# Patient Record
Sex: Female | Born: 1980
Health system: Southern US, Community
[De-identification: ages and names within clinical notes are randomized; demographics above are authoritative.]

## PROBLEM LIST (undated history)

## (undated) DIAGNOSIS — F329 Major depressive disorder, single episode, unspecified: Secondary | ICD-10-CM

## (undated) DIAGNOSIS — F32A Depression, unspecified: Secondary | ICD-10-CM

## (undated) DIAGNOSIS — Z87442 Personal history of urinary calculi: Secondary | ICD-10-CM

## (undated) DIAGNOSIS — K8020 Calculus of gallbladder without cholecystitis without obstruction: Secondary | ICD-10-CM

## (undated) DIAGNOSIS — R109 Unspecified abdominal pain: Principal | ICD-10-CM

## (undated) DIAGNOSIS — F419 Anxiety disorder, unspecified: Secondary | ICD-10-CM

## (undated) HISTORY — PX: TUBAL LIGATION: SHX77

## (undated) HISTORY — DX: Anxiety disorder, unspecified: F41.9

## (undated) HISTORY — PX: CHOLECYSTECTOMY: SHX55

## (undated) HISTORY — DX: Depression, unspecified: F32.A

## (undated) HISTORY — PX: COSMETIC SURGERY: SHX468

## (undated) HISTORY — PX: BREAST SURGERY: SHX581

## (undated) HISTORY — DX: Unspecified abdominal pain: R10.9

## (undated) HISTORY — DX: Calculus of gallbladder without cholecystitis without obstruction: K80.20

---
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
## 2011-08-31 ENCOUNTER — Emergency Department: Payer: Self-pay | Admitting: Emergency Medicine

## 2011-09-02 ENCOUNTER — Emergency Department: Payer: Self-pay | Admitting: Emergency Medicine

## 2012-10-08 DIAGNOSIS — K8020 Calculus of gallbladder without cholecystitis without obstruction: Secondary | ICD-10-CM

## 2012-10-08 DIAGNOSIS — R109 Unspecified abdominal pain: Secondary | ICD-10-CM

## 2012-10-08 HISTORY — DX: Calculus of gallbladder without cholecystitis without obstruction: K80.20

## 2012-10-08 HISTORY — DX: Unspecified abdominal pain: R10.9

## 2012-12-08 ENCOUNTER — Emergency Department: Payer: Self-pay | Admitting: Emergency Medicine

## 2012-12-08 LAB — URINALYSIS, COMPLETE
Bilirubin,UR: NEGATIVE
Blood: NEGATIVE
Ketone: NEGATIVE
Leukocyte Esterase: NEGATIVE
Ph: 7 (ref 4.5–8.0)
RBC,UR: <1 /HPF (ref 0–5)
Specific Gravity: 1.017 (ref 1.003–1.030)

## 2012-12-08 LAB — BASIC METABOLIC PANEL
Anion Gap: 7 (ref 7–16)
BUN: 13 mg/dL (ref 7–18)
Calcium, Total: 8.5 mg/dL (ref 8.5–10.1)
Co2: 26 mmol/L (ref 21–32)
EGFR (African American): >60
Glucose: 93 mg/dL (ref 65–99)
Sodium: 139 mmol/L (ref 136–145)

## 2012-12-08 LAB — CBC
HCT: 37.2 % (ref 35.0–47.0)
HGB: 12.4 g/dL (ref 12.0–16.0)
MCH: 28.3 pg (ref 26.0–34.0)
MCHC: 33.2 g/dL (ref 32.0–36.0)
MCV: 85 fL (ref 80–100)
RDW: 13.7 % (ref 11.5–14.5)
WBC: 10.4 10*3/uL (ref 3.6–11.0)

## 2012-12-08 LAB — LIPASE, BLOOD: Lipase: 133 U/L (ref 73–393)

## 2013-01-14 ENCOUNTER — Ambulatory Visit: Payer: Self-pay | Admitting: Family Medicine

## 2013-01-19 ENCOUNTER — Encounter: Payer: Self-pay | Admitting: General Surgery

## 2013-01-19 ENCOUNTER — Ambulatory Visit (INDEPENDENT_AMBULATORY_CARE_PROVIDER_SITE_OTHER): Payer: 59 | Admitting: General Surgery

## 2013-01-19 VITALS — BP 100/65 | HR 80 | Resp 16 | Ht 62.0 in | Wt 181.0 lb

## 2013-01-19 DIAGNOSIS — K8010 Calculus of gallbladder with chronic cholecystitis without obstruction: Secondary | ICD-10-CM

## 2013-01-19 DIAGNOSIS — K8020 Calculus of gallbladder without cholecystitis without obstruction: Secondary | ICD-10-CM | POA: Insufficient documentation

## 2013-01-19 DIAGNOSIS — R109 Unspecified abdominal pain: Secondary | ICD-10-CM

## 2013-01-19 NOTE — Progress Notes (Signed)
Patient ID: Shelly Williams, female   DOB: 01-15-1981, 32 y.o.   MRN: [id number]  Chief Complaint  Patient presents with  . Abdominal Pain    gallbladder     HPI Shelly Williams is a 32 y.o. female who presents for an evaluation of her gallbladder.  Abdominal Pain The current episode started more than 1 month ago. The onset quality is sudden. The problem has been gradually worsening. The pain is located in the RUQ. The pain is severe. The quality of the pain is sharp. The abdominal pain does not radiate. Associated symptoms include nausea and vomiting. Nothing aggravates the pain. The pain is relieved by nothing. She has tried nothing for the symptoms. Prior diagnostic workup includes ultrasound.    Past Medical History  Diagnosis Date  . Abdominal pain 2014  . Gallstones 2014    History reviewed. No pertinent past surgical history.  Family History  Problem Relation Age of Onset  . Cancer Maternal Aunt     lung  . Cancer Other     lung - maternal great aunt  . Cancer Maternal Aunt     breast    Social History History  Substance Use Topics  . Smoking status: Never Smoker   . Smokeless tobacco: Never Used  . Alcohol Use: No    Allergies  Allergen Reactions  . Motrin (Ibuprofen) Rash    No current outpatient prescriptions on file.   No current facility-administered medications for this visit.    Review of Systems Review of Systems  Constitutional: Positive for chills.  Respiratory: Negative.   Cardiovascular: Negative.   Gastrointestinal: Positive for nausea, vomiting and abdominal pain.    Blood pressure 100/65, pulse 80, resp. rate 16, height 5\' 2"  (1.575 m), weight 181 lb (82.101 kg), last menstrual period 12/16/2012.  Physical Exam Physical Exam  Constitutional: She appears well-developed and well-nourished.  Eyes: Conjunctivae are normal. No scleral icterus.  Neck: Trachea normal. No mass and no thyromegaly present.  Cardiovascular: Normal rate, regular  rhythm, normal heart sounds and normal pulses.   No murmur heard. Pulmonary/Chest: Effort normal and breath sounds normal.  Abdominal: Soft. Normal appearance and bowel sounds are normal. There is no hepatosplenomegaly. There is no tenderness. No hernia.    Data Reviewed Ultrasound reviewed showing large impacted stone in gallbladder neck. No evidence of acute cholecystitis. CBC and liver functions normal.   Assessment    Cholelithiasis, chronic cholecystitis     Plan    Discussed laparoscopic cholecystectomy, risks and benefits.  Pt is agreeable.       SANKAR,SEEPLAPUTHUR G 01/19/2013, 7:13 PM

## 2013-01-19 NOTE — Patient Instructions (Addendum)
Patient advised that gallbladder surgery is necessary at this time.  Patient advised she will be out of work for 5-7 days.   Laparoscopic Cholecystectomy Laparoscopic cholecystectomy is surgery to remove the gallbladder. The gallbladder is located slightly to the right of center in the abdomen, behind the liver. It is a concentrating and storage sac for the bile produced in the liver. Bile aids in the digestion and absorption of fats. Gallbladder disease (cholecystitis) is an inflammation of your gallbladder. This condition is usually caused by a buildup of gallstones (cholelithiasis) in your gallbladder. Gallstones can block the flow of bile, resulting in inflammation and pain. In severe cases, emergency surgery may be required. When emergency surgery is not required, you will have time to prepare for the procedure. Laparoscopic surgery is an alternative to open surgery. Laparoscopic surgery usually has a shorter recovery time. Your common bile duct may also need to be examined and explored. Your caregiver will discuss this with you if he or she feels this should be done. If stones are found in the common bile duct, they may be removed. LET YOUR CAREGIVER KNOW ABOUT:  Allergies to food or medicine.  Medicines taken, including vitamins, herbs, eyedrops, over-the-counter medicines, and creams.  Use of steroids (by mouth or creams).  Previous problems with anesthetics or numbing medicines.  History of bleeding problems or blood clots.  Previous surgery.  Other health problems, including diabetes and kidney problems.  Possibility of pregnancy, if this applies. RISKS AND COMPLICATIONS All surgery is associated with risks. Some problems that may occur following this procedure include:  Infection.  Damage to the common bile duct, nerves, arteries, veins, or other internal organs such as the stomach or intestines.  Bleeding.  A stone may remain in the common bile duct. BEFORE THE  PROCEDURE  Do not take aspirin for 3 days prior to surgery or blood thinners for 1 week prior to surgery.  Do not eat or drink anything after midnight the night before surgery.  Let your caregiver know if you develop a cold or other infectious problem prior to surgery.  You should be present 60 minutes before the procedure or as directed. PROCEDURE  You will be given medicine that makes you sleep (general anesthetic). When you are asleep, your surgeon will make several small cuts (incisions) in your abdomen. One of these incisions is used to insert a small, lighted scope (laparoscope) into the abdomen. The laparoscope helps the surgeon see into your abdomen. Carbon dioxide gas will be pumped into your abdomen. The gas allows more room for the surgeon to perform your surgery. Other operating instruments are inserted through the other incisions. Laparoscopic procedures may not be appropriate when:  There is major scarring from previous surgery.  The gallbladder is extremely inflamed.  There are bleeding disorders or unexpected cirrhosis of the liver.  A pregnancy is near term.  Other conditions make the laparoscopic procedure impossible. If your surgeon feels it is not safe to continue with a laparoscopic procedure, he or she will perform an open abdominal procedure. In this case, the surgeon will make an incision to open the abdomen. This gives the surgeon a larger view and field to work within. This may allow the surgeon to perform procedures that sometimes cannot be performed with a laparoscope alone. Open surgery has a longer recovery time. AFTER THE PROCEDURE  You will be taken to the recovery area where a nurse will watch and check your progress.  You may be allowed to  go home the same day.  Do not resume physical activities until directed by your caregiver.  You may resume a normal diet and activities as directed. Document Released: 09/24/2005 Document Revised: 12/17/2011  Document Reviewed: 03/09/2011 [facility] Patient Information 2013 [place], [place].  Patient's surgery has been scheduled for 01-22-13 at [facility]

## 2013-01-22 ENCOUNTER — Ambulatory Visit: Payer: Self-pay | Admitting: General Surgery

## 2013-01-22 DIAGNOSIS — K8010 Calculus of gallbladder with chronic cholecystitis without obstruction: Secondary | ICD-10-CM

## 2013-01-26 ENCOUNTER — Encounter: Payer: Self-pay | Admitting: General Surgery

## 2013-01-26 ENCOUNTER — Encounter: Payer: Self-pay | Admitting: *Deleted

## 2013-01-27 ENCOUNTER — Encounter: Payer: Self-pay | Admitting: General Surgery

## 2013-02-02 ENCOUNTER — Encounter: Payer: 59 | Admitting: General Surgery

## 2013-02-18 ENCOUNTER — Encounter: Payer: 59 | Admitting: General Surgery

## 2013-03-05 ENCOUNTER — Encounter: Payer: Self-pay | Admitting: *Deleted

## 2013-12-24 ENCOUNTER — Encounter: Payer: Self-pay | Admitting: Obstetrics & Gynecology

## 2014-01-04 ENCOUNTER — Encounter
Admit: 2014-01-04 | Disposition: A | Discharge status: Home / Self Care | Payer: Self-pay | Admitting: Maternal & Fetal Medicine

## 2014-02-15 ENCOUNTER — Emergency Department: Payer: Self-pay | Admitting: Emergency Medicine

## 2014-05-03 ENCOUNTER — Inpatient Hospital Stay: Payer: Self-pay

## 2014-05-03 LAB — CBC WITH DIFFERENTIAL/PLATELET
Basophil #: 0.1 10*3/uL (ref 0.0–0.1)
Basophil %: 0.5 %
EOS PCT: 0.9 %
Eosinophil #: 0.1 10*3/uL (ref 0.0–0.7)
HCT: 38.2 % (ref 35.0–47.0)
HGB: 12.2 g/dL (ref 12.0–16.0)
Lymphocyte #: 2.1 10*3/uL (ref 1.0–3.6)
Lymphocyte %: 18.1 %
MCH: 28 pg (ref 26.0–34.0)
MCHC: 31.8 g/dL — ABNORMAL LOW (ref 32.0–36.0)
MCV: 88 fL (ref 80–100)
Monocyte #: 1 x10 3/mm — ABNORMAL HIGH (ref 0.2–0.9)
Monocyte %: 8.3 %
Neutrophil #: 8.4 10*3/uL — ABNORMAL HIGH (ref 1.4–6.5)
Neutrophil %: 72.2 %
Platelet: 115 10*3/uL — ABNORMAL LOW (ref 150–440)
RBC: 4.34 10*6/uL (ref 3.80–5.20)
RDW: 16 % — ABNORMAL HIGH (ref 11.5–14.5)
WBC: 11.6 10*3/uL — ABNORMAL HIGH (ref 3.6–11.0)

## 2014-05-05 LAB — HEMATOCRIT: HCT: 33.2 % — ABNORMAL LOW (ref 35.0–47.0)

## 2014-05-06 LAB — PATHOLOGY REPORT

## 2014-08-10 ENCOUNTER — Encounter: Payer: Self-pay | Admitting: General Surgery

## 2015-01-28 NOTE — Op Note (Signed)
PATIENT NAME:  Shelly Williams, Shelly Williams MR#:  [id number] DATE OF BIRTH:  02/18/1981  DATE OF PROCEDURE:  01/22/2013  PREOPERATIVE DIAGNOSIS: Chronic cholecystitis and cholelithiasis.   POSTOPERATIVE DIAGNOSIS: Chronic cholecystitis and cholelithiasis.   PROCEDURE:  Laparoscopy and cholecystectomy with intraoperative cholangiogram.   SURGEON: Mckinley Jewel, M.D.   ANESTHESIA: General.   COMPLICATIONS: None.   ESTIMATED BLOOD LOSS: Less than 25 mL.   DRAINS: None.   DESCRIPTION OF PROCEDURE: The patient was put to sleep in the supine position on the operating table. The abdomen was prepped and draped out as a sterile field. A small incision was made in the upper lip of the umbilicus and a Veress needle with the InnerDyne sleeve was positioned in the peritoneal cavity and verified with the hanging drop method. Pneumoperitoneum was obtained and a 10 mm port was placed. The camera was introduced with good visualization of the peritoneal cavity and no injury to the underlying structures from initial entry noted.  Epigastric and 2 lateral 5 mm ports were placed. The gallbladder was noted to be mildly thickened in its wall, but showed no evidence of acute changes. Minimal adhesions near the midportion down to the Hartmann's pouch were easily taken down to expose the cystic artery which was lying medial to the cystic duct.  Both of these structures were circumferentially freed. The common bile duct appeared to be of normal size. She did have a lymph node that appeared to be mildly enlarged on the course of the cystic artery near the gallbladder neck.  The Kumar clamp and catheter were positioned. Cholangiogram was then performed which showed normal appearing bile duct, both proximal and distal, no filling defects and no obstruction to flow. The cystic duct was somewhat long and tortuous and seemed to go further down before connecting with the bile duct. After this the catheter was used to decompress the gallbladder  and then removed.  The cystic artery and duct were then clipped and cut. The gallbladder was dissected free from its bed using cautery for control of bleeding. A small amount of fluid was used to irrigate this area and suctioned out. The gallbladder was then placed into a retrieval bag and brought out through the umbilical port site and noted that the patient had a very large stone which required slight extension of both the fascial and moderate extension of the skin incisions to remove. It was noted there was in addition some small stones and sludge. There was a large stone somewhat egg-shaped over 3 cm in size. Pneumoperitoneum was released and remaining ports were removed. The fascial opening at the umbilicus was closed with 2 figure-of-eight stitches of 0 Vicryl. All skin incisions were closed with subcuticular 4-0 Vicryl reinforced with Steri-Strips. A dry sterile dressing was placed. The patient tolerated the procedure well. There were no immediate problems encountered. She was extubated and returned to the recovery room in stable condition.  ____________________________ S.Robinette Haines, MD sgs:sb D: 01/22/2013 11:28:32 ET T: 01/22/2013 11:40:32 ET JOB#: [id number]  cc: S.G. Jamal Collin, MD, <Dictator> [facility] Robinette Haines MD ELECTRONICALLY SIGNED 01/22/2013 20:55

## 2015-01-29 NOTE — Consult Note (Signed)
Referral Information:  Reason for Referral Prior pregnancy with cerclage, here for recommendations.   Referring Physician [facility]   Prenatal Hx Shelly Williams is a 34 year-old [place] at 47 1/7 weeks ([place] 05/05/14) who presents for recommendations in her current pregnancy.  Her Ob history is as follows.  In 2001, during her first pregnancy, Shelly Williams states that she had regular contrations at 3-4 months of gestation and had a cerclage placed, afterwhich she delivered at term.  Her second pregnancy was an uncomplicated first trimester miscarriage. For her third pregnancy she had a prophylactic cerclage placed since she had a cerclage in her first pregnancy.  Finally, her fourth pregnancy she carried to term and did not have a cerclage.  She is without complaints. She denies vaginal bleeding, cramping, pelvic pressure or abnormal vaginal discharge.   Past Obstetrical Hx G5 P3013 2001: Had cramping/contractions at "3-4 months" and underwent cerclage placement. Carried to term. Had Induction of labor at 41 weeks and uncomplicated spontaneous vaginal delivery. Female infant weighing 7 pound 2 ounces 0932: Uncomplicated first trimester misscarriage, required D&C 2006: Prophylactic cerclage. Carried to term. Spontaneous labor at 39 weeks. Female infant weighing 7.5 pounds. No complications 3557: No cerclage. No 17P. Carried to term. Spontaneous labor at 39 weeks. Female infant weighing 6.5 pounds. No complications   Allergies:   Ibuprofen: Rash  Vital Signs/Notes:  Nursing Vital Signs:  **Vital Signs.:   19-Mar-15 08:58  Pulse Pulse 84  Systolic BP Systolic BP 322  Diastolic BP (mmHg) Diastolic BP (mmHg) 52   Perinatal Consult:  PGyn Hx Remote history of abnormal pap. Denies any procedures performed on her cervix    Past Medical History cont'd Denies history of HTN, diabetes, thyroid disorders   PSurg Hx As in HPI.  Cerclage times 2. D&C.  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 No vaginal bleeding, contractions, pelvic pressure, change in vaginal discharge, urine complaints.   Abdominal Pain No    Nausea/Vomiting No    SOB/DOE No    Exam:  Cervix Long/closed/high/firm and posterior    Additional Lab/Radiology Notes Prenatal labs (10/20/13): Blood type A positive, antibody screen negative, HIV non-reactive, Hep B negative, Varicella immune, Rubella immune, Hct 36.4, Plt 143, MCV 85, Hemoglobin electrophoresis normal, Early glucola 109, pap normal  Repeat CBC (10/27/13): Hct 34.0, MCV 84, Plt count 144  First trimester screen [name]):  Down syndrome risk less than 1 in 10,000.  Trisomy 18 risk less than 1 in 10,000   Impression/Recommendations:  Impression 34 year-old G5 P3013 at 22 1/7 weeks with two prior pregnancies with cerclages, the first for what sounds like preterm cramping and the second as a prophylactic measure.  Her most recent pregnancy was carried to term without cerclage.   Recommendations From her history, it does not appear that [place] has a history of painless cervical dilation or cervical shortening to suggest that she has an incompetent cervix. Furthermore, she carried her last pregnancy to 39 weeks without a cerclage. Her cervical length on [place] today is reassuring as is her digital cervical examination.  Recommend to follow cervical lengths by [place] weekly. We have scheduled the next two to be done at [facility] but can be done at [place] if  preferred. Shoudl there be signs of cervical shortening, then we can discuss further options. We also discussed signs and symptoms of preterm labor and cervical incompetence.  Her platelet count is mildly low (140s in January).  She denies a  history of thrombocytopenia. Recommend to check monthly and send her back for further recommendations should her platelet count fall below 100.    Total Time Spent with Patient 60 minutes   >50% of visit spent in couseling/coordination of care yes   Office Use Only 99244  Level 4 (62min) NEW office consult low complexity   Coding Description: MATERNAL CONDITIONS/HISTORY INDICATION(S).   OTHER: history of cerclage in prior pregnancy..  Electronic Signatures: Quantarius Genrich, [place] (MD)  (Signed 19-Mar-15 12:17)  Authored: Referral, Home Medications, Allergies, Vital Signs/Notes, Consult, Exam, Lab/Radiology Notes, Impression, Billing, Coding Description   Last Updated: 19-Mar-15 12:17 by Orra Nolde, [place] (MD)

## 2015-01-29 NOTE — Op Note (Signed)
PATIENT NAME:  Shelly Williams, Shelly Williams MR#:  [id number] DATE OF BIRTH:  1981-09-10  DATE OF PROCEDURE:  05/04/2014  PREOPERATIVE DIAGNOSIS:   1.  A 34 year old G5, P4-0-1-4, postpartum day #1 following uncomplicated vaginal delivery.  2.  Desires permanent surgical sterilization.   POSTOPERATIVE DIAGNOSIS:  1.  A 34 year old G5, P4-0-1-4, postpartum day #1 following uncomplicated vaginal delivery.  2.  Desires permanent surgical sterilization.    OPERATION PERFORMED: Bilateral postpartum tubal ligation via Pomeroy method.   ANESTHESIA: General.   PRIMARY SURGEON:  Stoney Bang. Georgianne Fick, MD.   PREOPERATIVE ANTIBIOTICS: None.   DRAINS AND TUBES: None.   IMPLANTS: None.   ESTIMATED BLOOD LOSS: Minimal.   OPERATIVE FLUIDS: 500 mL.   COMPLICATIONS: None.   FINDINGS: Normal tubes and ovaries with normal-appearing uterine fundus at the level of the umbilicus. Following excision of the tubes bilateral cross section of tubal ostia were visualized.  The tubes were hemostatic. Both tubes were walked out to the fimbriated ends prior to transection.   SPECIMENS REMOVED: Portions of right and left tubes.   PATIENT CONDITION FOLLOWING PROCEDURE: Stable.   PROCEDURE IN DETAIL: Risks, benefits, and alternatives as well as the permanent nature of the procedure were discussed with the patient prior proceeding to operating room. The patient was taken to the operating room where she was placed under general endotracheal anesthesia. She was positioned in the supine position, prepped and draped in the usual sterile fashion. A timeout was performed. Attention was turned to the patient's abdomen. The umbilicus was infiltrated with 0.5% plain Sensorcaine.   A vertical incision was then made utilizing the linea nigra and extended approximately 3 cm inferior to the umbilicus. The subcutaneous tissue was dissected off the fascia using a hemostat. The fascia was grasped with a hemostat, tented up, and regrasped with a  2nd hemostat. The 1st  hemostat released the fascia and was regrasped. Mayo scissors were then used to make the fascial incision. The fascial incision was extended by spreading the Mayo scissors. The peritoneum was then identified, grasped with a hemostat, tented up in a similar fashion as the fascia. The scissors tips were clearly seen through the peritoneum prior to incising the peritoneum.    Once intraabdominal entry had been established an Army-Navy retractor was used to visualize the intraabdominal contents. The patient was airplaned to her right. A moist mini-lap was then used to pack away the omentum. The left tube was identified at the cornual section and walked out to its fimbriated end before walking it back to the mid-isthmic portion. It was then doubly suture ligated using a 0-chromic wheel. The intervening knuckle of tube was then excised using Metzenbaum scissors. The tubal segment was returned to the abdomen. The mini lap was removed.   The patient was airplaned to her left. The mini-lap was replaced to displace the omentum. The right tube was identified in its cornual region, walked out to its fimbriated end before walking back to the mid-isthmic portion. It was then also doubly suture ligated using a 0-chromic wheel. The intervening knuckle of tube was excised using Metzenbaum scissors. The tubal segment was inspected and noted to be hemostatic. The tubal ostia were clearly visualized as they had been on the left.  The tubal segment was returned to the abdomen.   The mini-lap was removed. The fascia was grasped with 2 hemostats. The fascial defect was then closed using a 0-Vicryl on a UR-6. Following closure of the fascia the skin was closed using 4-0  Monocryl in subcuticular fashion. The incision was dressed with Dermabond. Sponge, needle, and instrument counts were correct x2. The patient tolerated the procedure well and was taken to the recovery room in stable condition.      ____________________________ Stoney Bang. Georgianne Fick, MD ams:lt D: 05/04/2014 09:35:52 ET T: 05/04/2014 10:22:25 ET JOB#: [id number]  cc: Stoney Bang. Georgianne Fick, MD, <Dictator> Dorthula Nettles MD ELECTRONICALLY SIGNED 05/05/2014 6:07

## 2015-02-15 NOTE — H&P (Signed)
L&D Evaluation:  History Expanded:  HPI 34 yo G5 P3013 with EDD of 05/05/14 per 11 wk [place]. Presents with c/o regular contractions since 1830. Denies LOF, decreased FM or VB. PNC at [facility], early entry to care, thrombocytopenia. H/o cerclage during prior pregnancies -cervical length remained long during this pregnancy. Desires PP BTL.   Blood Type (Maternal) A positive   Group B Strep Results Maternal (Result >5wks must be treated as unknown) negative   Maternal HIV Negative   Maternal Syphilis Ab Nonreactive   Maternal Varicella Immune   Rubella Results (Maternal) immune   Patient's Medical History No Chronic Illness   Patient's Surgical History Colecystectomy   Medications Pre Natal Vitamins   Allergies NKDA   Social History none   Exam:  Vital Signs stable   General no apparent distress   Mental Status clear   Chest clear   Heart no murmur/gallop/rubs   Abdomen gravid, tender with contractions   Estimated Fetal Weight Average for gestational age, 26 lb weight gain, EFW at 33 wks 55%   Pelvic 4-5 cm per RN   Mebranes Intact   FHT normal rate with no decels, decreased variability after stadol   Ucx regular   Impression:  Impression active labor   Plan:  Comments Awaiting labs for epidural Anticipate vaginal delivery   Electronic Signatures: Ander Purpura (CNM)  (Signed 27-Jul-15 20:50)  Authored: L&D Evaluation   Last Updated: 27-Jul-15 20:50 by Ander Purpura (CNM)

## 2017-06-01 ENCOUNTER — Emergency Department (HOSPITAL_COMMUNITY): Payer: BLUE CROSS/BLUE SHIELD

## 2017-06-01 ENCOUNTER — Encounter (HOSPITAL_COMMUNITY): Payer: Self-pay | Admitting: *Deleted

## 2017-06-01 ENCOUNTER — Emergency Department (HOSPITAL_COMMUNITY)
Admission: EM | Admit: 2017-06-01 | Discharge: 2017-06-01 | Disposition: A | Discharge status: Home / Self Care | Payer: BLUE CROSS/BLUE SHIELD | Attending: Emergency Medicine | Admitting: Emergency Medicine

## 2017-06-01 DIAGNOSIS — R110 Nausea: Secondary | ICD-10-CM | POA: Insufficient documentation

## 2017-06-01 DIAGNOSIS — G4489 Other headache syndrome: Secondary | ICD-10-CM

## 2017-06-01 DIAGNOSIS — R200 Anesthesia of skin: Secondary | ICD-10-CM | POA: Diagnosis not present

## 2017-06-01 DIAGNOSIS — R51 Headache: Secondary | ICD-10-CM | POA: Diagnosis present

## 2017-06-01 MED ORDER — METOCLOPRAMIDE HCL 10 MG PO TABS: 10.0000 mg | ORAL_TABLET | Freq: Three times a day (TID) | ORAL | 0 refills | Status: DC | PRN

## 2017-06-01 MED ORDER — METOCLOPRAMIDE HCL 5 MG/ML IJ SOLN
10.0000 mg | Freq: Once | INTRAMUSCULAR | Status: AC
  Administered 2017-06-01: 10 mg via INTRAVENOUS
  Filled 2017-06-01: qty 2

## 2017-06-01 MED ORDER — DIPHENHYDRAMINE HCL 50 MG/ML IJ SOLN
25.0000 mg | Freq: Once | INTRAMUSCULAR | Status: AC
  Administered 2017-06-01: 25 mg via INTRAVENOUS
  Filled 2017-06-01: qty 1

## 2017-06-01 NOTE — ED Provider Notes (Signed)
Reviewed CT findings with dr Jola Baptist, radiology No acute findings    Ripley Fraise, MD 06/01/17 [id number]

## 2017-06-01 NOTE — ED Notes (Signed)
EDP at bedside updating patient and family. 

## 2017-06-01 NOTE — ED Provider Notes (Signed)
[facility] DEPT Provider Note   CSN: [id number] Arrival date & time: 06/01/17  7124     History   Chief Complaint Chief Complaint  Patient presents with  . Headache    HPI Shelly Williams is a 36 y.o. female.  The history is provided by the patient and the spouse. A language interpreter was used [id number]).  Headache   This is a chronic problem. The current episode started more than 2 days ago. The problem has been gradually worsening. The quality of the pain is described as throbbing. The pain is moderate. The pain radiates to the left neck. Associated symptoms include nausea. Pertinent negatives include no fever and no vomiting. Treatments tried: excedrin. The treatment provided no relief.  Patient reports intermittent headaches for months She also reports at time she will have numbness to mouth - mostly around her lip, like dental anesthesia Tonight ,her headache returned and worsened and did not improve with excedrin No falls/trauma No fever/vomiting No visual changes Traveled to [place] a month ago, but did have headaches prior to the trip  Past Medical History:  Diagnosis Date  . Abdominal pain 2014  . Gallstones 2014    Patient Active Problem List   Diagnosis Date Noted  . Calculus of gallbladder with other cholecystitis, without mention of obstruction 01/19/2013  . Gallstones     Past Surgical History:  Procedure Laterality Date  . CHOLECYSTECTOMY      OB History    Gravida Para Term Preterm AB Living   4 3     1 3    SAB TAB Ectopic Multiple Live Births   1              Obstetric Comments   1st Menstrual Cycle: 15 1st Pregnancy: 20       Home Medications    Prior to Admission medications   Not on File    Family History Family History  Problem Relation Age of Onset  . Cancer Maternal Aunt        lung  . Cancer Other        lung - maternal great aunt  . Cancer Maternal Aunt        breast    Social History Social History  Substance  Use Topics  . Smoking status: Never Smoker  . Smokeless tobacco: Never Used  . Alcohol use No     Allergies   Motrin [ibuprofen]   Review of Systems Review of Systems  Constitutional: Negative for fever.  Gastrointestinal: Positive for nausea. Negative for vomiting.  Neurological: Positive for numbness and headaches. Negative for weakness.  All other systems reviewed and are negative.    Physical Exam Updated Vital Signs BP 119/74 (BP Location: Left Arm)   Pulse 78   Temp 98 F (36.7 C) (Oral)   Resp 16   LMP 05/22/2017   SpO2 100%   Physical Exam CONSTITUTIONAL: Well developed/well nourished HEAD: Normocephalic/atraumatic EYES: EOMI/PERRL, no nystagmus, no ptosis ENMT: Mucous membranes moist NECK: supple no meningeal signs, no bruits SPINE/BACK:entire spine nontender CV: S1/S2 noted, no murmurs/rubs/gallops noted LUNGS: Lungs are clear to auscultation bilaterally, no apparent distress ABDOMEN: soft, nontender, no rebound or guarding GU:no cva tenderness NEURO:Awake/alert, face symmetric, no arm or leg drift is noted Equal 5/5 strength with shoulder abduction, elbow flex/extension, wrist flex/extension in upper extremities and equal hand grips bilaterally Equal 5/5 strength with hip flexion,knee flex/extension, foot dorsi/plantar flexion Other than reported numbness to left side of lips, Cranial nerves 3/4/5/6/04/15/09/11/12 tested  and intact Gait normal without ataxia No past pointing Sensation to light touch intact in all extremities EXTREMITIES: pulses normal, full ROM SKIN: warm, color normal PSYCH: no abnormalities of mood noted, alert and oriented to situation    ED Treatments / Results  Labs (all labs ordered are listed, but only abnormal results are displayed) Labs Reviewed - No data to display  EKG  EKG Interpretation None       Radiology Ct Head Wo Contrast  Result Date: 06/01/2017 CLINICAL DATA:  Acute headache.  Normal neurologic exam.  EXAM: CT HEAD WITHOUT CONTRAST TECHNIQUE: Contiguous axial images were obtained from the base of the skull through the vertex without intravenous contrast. COMPARISON:  None. FINDINGS: Brain: No intracranial hemorrhage, mass effect, or midline shift. No hydrocephalus. Incidental cavum septum pellucidum, normal variant. The basilar cisterns are patent. No evidence of territorial infarct or acute ischemia. No extra-axial or intracranial fluid collection. Vascular: No hyperdense vessel or unexpected calcification. Skull: Normal. Negative for fracture or focal lesion. Sinuses/Orbits: Paranasal sinuses and mastoid air cells are clear. The visualized orbits are unremarkable. Other: None. IMPRESSION: No acute intracranial abnormality.  No explanation for headache. Electronically Signed   By: Jeb Levering M.D.   On: 06/01/2017 06:58    Procedures Procedures    Medications Ordered in ED Medications  metoCLOPramide (REGLAN) injection 10 mg (10 mg Intravenous Given 06/01/17 0631)  diphenhydrAMINE (BENADRYL) injection 25 mg (25 mg Intravenous Given 06/01/17 0631)     Initial Impression / Assessment and Plan / ED Course  I have reviewed the triage vital signs and the nursing notes.  Pertinent  imaging results that were available during my care of the patient were reviewed by me and considered in my medical decision making (see chart for details).     6:42 AM Pt reports HA intermittently for months, worse tonight No focal neuro deficits Due to chronicity of HA, will obtain CT head If negative will be appropriate for d/c home and neuro followup  7:17 AM Pt improved No distress CT head negative No signs of stroke or other acute neurologic process Will refer to neurologist Discussed strict ER return precautions Pt requests medicine at discharge, will give course of reglan  Final Clinical Impressions(s) / ED Diagnoses   Final diagnoses:  Other headache syndrome    New Prescriptions New  Prescriptions   METOCLOPRAMIDE (REGLAN) 10 MG TABLET    Take 1 tablet (10 mg total) by mouth every 8 (eight) hours as needed for nausea (nausea/headache).     Ripley Fraise, MD 06/01/17 [phone]

## 2017-06-01 NOTE — ED Notes (Signed)
Pt returned from CT. Pt ambulatory.

## 2017-06-01 NOTE — ED Triage Notes (Addendum)
Pt has a left sided headache, left sided neck, and left sided facial numbness x 4 months. Pt reports waking up tonight and her headache was worse and the pain was not relieved with Excedrin.

## 2017-09-11 DIAGNOSIS — H5231 Anisometropia: Secondary | ICD-10-CM | POA: Diagnosis not present

## 2019-06-22 ENCOUNTER — Ambulatory Visit: Payer: Self-pay | Admitting: Physician Assistant

## 2019-08-28 ENCOUNTER — Telehealth: Payer: Self-pay

## 2019-08-28 NOTE — Telephone Encounter (Signed)
Opened in error

## 2019-08-31 ENCOUNTER — Ambulatory Visit (INDEPENDENT_AMBULATORY_CARE_PROVIDER_SITE_OTHER): Payer: BC Managed Care – PPO | Admitting: Family Medicine

## 2019-08-31 ENCOUNTER — Telehealth: Payer: Self-pay

## 2019-08-31 ENCOUNTER — Ambulatory Visit
Admission: RE | Admit: 2019-08-31 | Discharge: 2019-08-31 | Disposition: A | Discharge status: Home / Self Care | Payer: BC Managed Care – PPO | Attending: Family Medicine | Admitting: Family Medicine

## 2019-08-31 ENCOUNTER — Encounter: Payer: Self-pay | Admitting: Family Medicine

## 2019-08-31 ENCOUNTER — Other Ambulatory Visit: Payer: Self-pay

## 2019-08-31 ENCOUNTER — Ambulatory Visit
Admission: RE | Admit: 2019-08-31 | Discharge: 2019-08-31 | Disposition: A | Discharge status: Home / Self Care | Payer: BC Managed Care – PPO | Source: Ambulatory Visit | Attending: Family Medicine | Admitting: Family Medicine

## 2019-08-31 VITALS — BP 101/69 | HR 79 | Temp 96.9°F | Resp 16 | Ht 62.0 in | Wt 205.0 lb

## 2019-08-31 DIAGNOSIS — R110 Nausea: Secondary | ICD-10-CM | POA: Insufficient documentation

## 2019-08-31 DIAGNOSIS — M25562 Pain in left knee: Secondary | ICD-10-CM

## 2019-08-31 DIAGNOSIS — Z23 Encounter for immunization: Secondary | ICD-10-CM

## 2019-08-31 DIAGNOSIS — G8929 Other chronic pain: Secondary | ICD-10-CM

## 2019-08-31 DIAGNOSIS — R296 Repeated falls: Secondary | ICD-10-CM | POA: Insufficient documentation

## 2019-08-31 DIAGNOSIS — F411 Generalized anxiety disorder: Secondary | ICD-10-CM | POA: Insufficient documentation

## 2019-08-31 DIAGNOSIS — F331 Major depressive disorder, recurrent, moderate: Secondary | ICD-10-CM

## 2019-08-31 DIAGNOSIS — F329 Major depressive disorder, single episode, unspecified: Secondary | ICD-10-CM | POA: Insufficient documentation

## 2019-08-31 DIAGNOSIS — R29818 Other symptoms and signs involving the nervous system: Secondary | ICD-10-CM | POA: Diagnosis not present

## 2019-08-31 DIAGNOSIS — Z6837 Body mass index (BMI) 37.0-37.9, adult: Secondary | ICD-10-CM

## 2019-08-31 DIAGNOSIS — E669 Obesity, unspecified: Secondary | ICD-10-CM | POA: Insufficient documentation

## 2019-08-31 MED ORDER — TRAZODONE HCL 50 MG PO TABS: 50.0000 mg | ORAL_TABLET | Freq: Every evening | ORAL | 3 refills | Status: DC | PRN

## 2019-08-31 MED ORDER — FLUOXETINE HCL 40 MG PO CAPS: 40.0000 mg | ORAL_CAPSULE | Freq: Every day | ORAL | 3 refills | Status: DC

## 2019-08-31 MED ORDER — BUSPIRONE HCL 10 MG PO TABS: 10.0000 mg | ORAL_TABLET | Freq: Three times a day (TID) | ORAL | 3 refills | Status: DC

## 2019-08-31 NOTE — Assessment & Plan Note (Signed)
Patient with left knee pain for greater than 1 year that is chronic, stable, intermittent Exam is benign today with no joint line tenderness or signs of ligamental or meniscus injury Advised rest, ice, elevation, compression as needed for walking We will obtain x-ray to ensure no arthritis or other issues Advised quad strengthening Can consider Ortho or PT referral in the future if persists

## 2019-08-31 NOTE — Telephone Encounter (Signed)
Called patient to ask about previous provider office name and phone number. Patient reports she will call back with information.

## 2019-08-31 NOTE — Assessment & Plan Note (Signed)
Chronic and uncontrolled She was previously doing well on Prozac, BuSpar, trazodone, which we will resume at previous doses Discussed the importance of therapy and its synergistic effects with SSRIs Discussed potential side effects of SSRIs and her other medications Discussed that SSRIs can take 6 to 8 weeks to reach full efficacy Discussed return precautions Contracted for safety-no SI/HI and no intimate partner violence and she is safe at home At follow-up, repeat PHQ-9 and GAD-7 and consider any dose titrations as needed

## 2019-08-31 NOTE — Assessment & Plan Note (Signed)
Patient with multiple falls that seem mechanical, but after she fell she is not sure what she tripped over or how she fell, as well as some transient neurologic symptoms like vision loss and hand weakness and spasm I suspect that some of this is related to somatic symptoms from her GAD and MDD, but she is in the demographic to be at risk for multiple sclerosis I discussed with the patient that the fact that her symptoms improve when she relaxes and takes deep breaths is reassuring that these are likely somatic symptoms We will resume her psychiatric medications as above I have advised her that she should see neurology to rule out any other causes however -referral placed today

## 2019-08-31 NOTE — Telephone Encounter (Signed)
Patient returned call with the following information: joseph rabinowitz [address] [place] [phone] Elna Breslow is the physician nurse

## 2019-08-31 NOTE — Progress Notes (Signed)
Patient: Shelly Williams, Female    DOB: 05/24/1981, 38 y.o.   MRN: [id number] Visit Date: 08/31/2019  Today's Provider: Lavon Paganini, MD   Chief Complaint  Patient presents with   New Patient (Initial Visit)   Subjective:     Establish care Shelly Williams is a 38 y.o. female who presents today to establish care. Patient reports feeling well overall.   Patient reports she was on Fluoxetine 40 mg daily, Buspirone 10 mg BID and Trazodone 50 mg PRN previously. Medications were prescribed by Dr Melanie Crazier.  She states that she was taking these after having a problem at home after her husband lost his job.  She states he has changed to a feels safe at home, but she finds she continues to worry a lot and feels stressed.  She reports depressed mood and difficulty sleeping.  She had to stop her medications when she moved to [place] temporarily, but would like to resume them as they were helpful.   She reports intermittent neurologic symptoms including her hand spasming and feeling weak.  She is also had 2 episodes where she reports total vision loss while driving.  At the time she felt like her eyes were burning and then she lost all vision and after resting for about 5 minutes it returned.  Outside of these times, she denies any vision changes or extremity weakness.  She states that her previous PCP had told her to relax and take deep breaths when her hands spasmed up and that the BuSpar helped with this as well.  She states that she occasionally wakes up nauseated.  She takes OTC nausea medication and it helps.  She does not know the name of this medication.  She has not noticed any trend or what seems to make this worse.  She denies any vomiting, diarrhea, constipation, fever.  Patient has left knee pain intermittently over the last year.  The pain is in her medial knee and is worse with walking.  She has not noticed anything that makes it better.  She has not tried any  medications.  She has no previous trauma or injury, but she has fallen about 3 times in the last year and landed on it.  There is no swelling or redness.  The motion seems to be intact.  She has never had this evaluated before.  It feels as though it is crunchy pops a lot  -----------------------------------------------------------------   Review of Systems  Constitutional: Positive for activity change and fatigue. Negative for appetite change, chills, diaphoresis, fever and unexpected weight change.  HENT: Negative.   Eyes: Positive for visual disturbance. Negative for photophobia, pain, discharge, redness and itching.  Respiratory: Negative.   Cardiovascular: Negative.   Gastrointestinal: Positive for nausea. Negative for abdominal distention, abdominal pain, anal bleeding, blood in stool, constipation, diarrhea, rectal pain and vomiting.  Endocrine: Negative.   Genitourinary: Positive for vaginal pain. Negative for decreased urine volume, difficulty urinating, dyspareunia, dysuria, enuresis, flank pain, frequency, genital sores, hematuria, menstrual problem, pelvic pain, urgency, vaginal bleeding and vaginal discharge.  Musculoskeletal: Positive for arthralgias. Negative for back pain, gait problem, joint swelling, myalgias, neck pain and neck stiffness.  Skin: Negative.   Allergic/Immunologic: Negative.   Neurological: Negative.   Hematological: Negative.   Psychiatric/Behavioral: Negative.     Social History      She  reports that she has never smoked. She has never used smokeless tobacco. She reports that she does not  drink alcohol or use drugs.       Social History   Socioeconomic History   Marital status: Married    Spouse name: Not on file   Number of children: 4   Years of education: Not on file   Highest education level: Not on file  Occupational History   Not on file  Social Needs   Financial resource strain: Not on file   Food insecurity    Worry: Not on file     Inability: Not on file   Transportation needs    Medical: Not on file    Non-medical: Not on file  Tobacco Use   Smoking status: Never Smoker   Smokeless tobacco: Never Used  Substance and Sexual Activity   Alcohol use: No   Drug use: No   Sexual activity: Yes    Partners: Male    Birth control/protection: Surgical  Lifestyle   Physical activity    Days per week: Not on file    Minutes per session: Not on file   Stress: Not on file  Relationships   Social connections    Talks on phone: Not on file    Gets together: Not on file    Attends religious service: Not on file    Active member of club or organization: Not on file    Attends meetings of clubs or organizations: Not on file    Relationship status: Not on file  Other Topics Concern   Not on file  Social History Narrative   Not on file    Past Medical History:  Diagnosis Date   Abdominal pain 2014   Depression    Gallstones 2014     Patient Active Problem List   Diagnosis Date Noted   Class 2 obesity without serious comorbidity with body mass index (BMI) of 37.0 to 37.9 in adult 08/31/2019   Nausea 08/31/2019   GAD (generalized anxiety disorder) 08/31/2019   Moderate episode of recurrent major depressive disorder ([place]) 08/31/2019   Multiple falls 08/31/2019   Transient neurological symptoms 08/31/2019   Chronic pain of left knee 08/31/2019    Past Surgical History:  Procedure Laterality Date   CHOLECYSTECTOMY     TUBAL LIGATION      Family History        Family Status  Relation Name Status   Mat Aunt  (Not Specified)   Other  (Not Specified)   Mat Aunt  (Not Specified)   Mother  Alive   Father  Alive   Sister  [place]   Daughter  Alive   Son  Alive   Daughter  Alive   Daughter  Alive        Her family history includes Cancer in her maternal aunt, maternal aunt and another family member; Healthy in her brother, daughter, daughter, daughter,  father, mother, sister, and son.      Allergies  Allergen Reactions   Motrin [Ibuprofen] Rash     Current Outpatient Medications:    busPIRone (BUSPAR) 10 MG tablet, Take 1 tablet (10 mg total) by mouth 3 (three) times daily., Disp: 90 tablet, Rfl: 3   FLUoxetine (PROZAC) 40 MG capsule, Take 1 capsule (40 mg total) by mouth daily., Disp: 30 capsule, Rfl: 3   traZODone (DESYREL) 50 MG tablet, Take 1 tablet (50 mg total) by mouth at bedtime as needed for sleep., Disp: 30 tablet, Rfl: 3   Patient Care Team: Virginia Crews, MD as PCP -  General (Family Medicine) Christene Lye, MD (General Surgery) Christene Lye, MD (General Surgery) Luna Glasgow, DO (Family Medicine)    Objective:    Vitals: BP 101/69 (BP Location: Left Arm, Patient Position: Sitting, Cuff Size: Normal)    Pulse 79    Temp (!) 96.9 F (36.1 C) (Temporal)    Resp 16    Ht 5\' 2"  (1.575 m)    Wt 205 lb (93 kg)    BMI 37.49 kg/m    Vitals:   08/31/19 0919  BP: 101/69  Pulse: 79  Resp: 16  Temp: (!) 96.9 F (36.1 C)  TempSrc: Temporal  Weight: 205 lb (93 kg)  Height: 5\' 2"  (1.575 m)     Physical Exam Vitals signs reviewed.  Constitutional:      General: She is not in acute distress.    Appearance: Normal appearance. She is well-developed. She is not diaphoretic.  HENT:     Head: Normocephalic and atraumatic.     Right Ear: Tympanic membrane, ear canal and external ear normal.     Left Ear: Tympanic membrane, ear canal and external ear normal.  Eyes:     General: No scleral icterus.    Conjunctiva/sclera: Conjunctivae normal.     Pupils: Pupils are equal, round, and reactive to light.  Neck:     Musculoskeletal: Neck supple.     Thyroid: No thyromegaly.  Cardiovascular:     Rate and Rhythm: Normal rate and regular rhythm.     Pulses: Normal pulses.     Heart sounds: Normal heart sounds. No murmur.  Pulmonary:     Effort: Pulmonary effort is normal. No respiratory  distress.     Breath sounds: Normal breath sounds. No wheezing or rales.  Abdominal:     General: There is no distension.     Palpations: Abdomen is soft.     Tenderness: There is no abdominal tenderness.  Musculoskeletal:        General: No deformity.     Right lower leg: No edema.     Left lower leg: No edema.     Comments: Left Knee: Normal to inspection with no erythema or effusion or obvious bony abnormalities.  Palpation normal with no warmth or joint line tenderness or patellar tenderness or condyle tenderness. ROM normal in flexion and extension and lower leg rotation. Ligaments with solid consistent endpoints including ACL, PCL, LCL, MCL. Negative Mcmurray's and provocative meniscal tests. Non painful patellar compression. Patellar and quadriceps tendons unremarkable. Hamstring and quadriceps strength is normal.   Lymphadenopathy:     Cervical: No cervical adenopathy.  Skin:    General: Skin is warm and dry.     Capillary Refill: Capillary refill takes less than 2 seconds.     Findings: No rash.  Neurological:     General: No focal deficit present.     Mental Status: She is alert and oriented to person, place, and time. Mental status is at baseline.     Cranial Nerves: No cranial nerve deficit.     Sensory: No sensory deficit.     Motor: No weakness.     Gait: Gait normal.  Psychiatric:        Attention and Perception: Attention normal.        Mood and Affect: Mood is depressed. Affect is tearful.        Speech: Speech normal.        Behavior: Behavior normal.        Thought  Content: Thought content normal. Thought content does not include homicidal or suicidal ideation.     GAD 7 : Generalized Anxiety Score 08/31/2019  Nervous, Anxious, on Edge 0  Control/stop worrying 1  Worry too much - different things 1  Trouble relaxing 1  Restless 0  Easily annoyed or irritable 1  Afraid - awful might happen 1  Total GAD 7 Score 5  Anxiety Difficulty Somewhat  difficult   Depression screen PHQ 2/9 08/31/2019  Decreased Interest 1  Down, Depressed, Hopeless 1  PHQ - 2 Score 2  Altered sleeping 1  Tired, decreased energy 1  Change in appetite 0  Feeling bad or failure about yourself  1  Trouble concentrating 2  Moving slowly or fidgety/restless 0  Suicidal thoughts 0  PHQ-9 Score 7  Difficult doing work/chores Somewhat difficult        Assessment & Plan:     Establish Care  Exercise Activities and Dietary recommendations Goals   None     Immunization History  Administered Date(s) Administered   Influenza,inj,Quad PF,6+ Mos 08/31/2019    Health Maintenance  Topic Date Due   HIV Screening  02/07/1996   TETANUS/TDAP  02/07/2000   PAP SMEAR-Modifier  02/06/2002   INFLUENZA VACCINE  05/09/2019     Discussed health benefits of physical activity, and encouraged her to engage in regular exercise appropriate for her age and condition.    ROI sent to previous PCP --------------------------------------------------------------------  Problem List Items Addressed This Visit      Other   Class 2 obesity without serious comorbidity with body mass index (BMI) of 37.0 to 37.9 in adult    Discussed importance of healthy weight management Discussed diet and exercise       Nausea    Occasional nausea without other symptoms Suspect this is mild GERD as it occurs in the mornings Discussed lifestyle changes such as not eating for 2 hours before she goes to bed, staying well-hydrated, decreasing spicy/acidic foods, and staying upright after eating for at least 1 hour She can continue to use the OTC agent that she is using at this time Discussed return precautions      GAD (generalized anxiety disorder)    Chronic and uncontrolled She was previously doing well on Prozac, BuSpar, trazodone, which we will resume at previous doses Discussed the importance of therapy and its synergistic effects with SSRIs Discussed potential side  effects of SSRIs and her other medications Discussed that SSRIs can take 6 to 8 weeks to reach full efficacy Discussed return precautions Contracted for safety-no SI/HI and no intimate partner violence and she is safe at home At follow-up, repeat PHQ-9 and GAD-7 and consider any dose titrations as needed      Relevant Medications   traZODone (DESYREL) 50 MG tablet   FLUoxetine (PROZAC) 40 MG capsule   busPIRone (BUSPAR) 10 MG tablet   Moderate episode of recurrent major depressive disorder (H[facility])    Chronic and uncontrolled She was previously doing well on Prozac, BuSpar, trazodone, which we will resume at previous doses Discussed the importance of therapy and its synergistic effects with SSRIs Discussed potential side effects of SSRIs and her other medications Discussed that SSRIs can take 6 to 8 weeks to reach full efficacy Discussed return precautions Contracted for safety-no SI/HI and no intimate partner violence and she is safe at home At follow-up, repeat PHQ-9 and GAD-7 and consider any dose titrations as needed      Relevant Medications  traZODone (DESYREL) 50 MG tablet   FLUoxetine (PROZAC) 40 MG capsule   busPIRone (BUSPAR) 10 MG tablet   Multiple falls    Patient with multiple falls that seem mechanical, but after she fell she is not sure what she tripped over or how she fell, as well as some transient neurologic symptoms like vision loss and hand weakness and spasm I suspect that some of this is related to somatic symptoms from her GAD and MDD, but she is in the demographic to be at risk for multiple sclerosis I discussed with the patient that the fact that her symptoms improve when she relaxes and takes deep breaths is reassuring that these are likely somatic symptoms We will resume her psychiatric medications as above I have advised her that she should see neurology to rule out any other causes however -referral placed today      Relevant Orders   Ambulatory referral  to Neurology   Transient neurological symptoms    As above, patient is having transient neurologic symptoms including vision loss and hand weakness/spasm I suspect that these are somatic symptoms, especially as they improve with relaxation techniques, but she is the right demographic to possibly have multiple sclerosis, so will refer to neurology for further evaluation She is neuro intact on exam today and her psychiatric medications will be resumed as above      Relevant Orders   Ambulatory referral to Neurology   Chronic pain of left knee - Primary    Patient with left knee pain for greater than 1 year that is chronic, stable, intermittent Exam is benign today with no joint line tenderness or signs of ligamental or meniscus injury Advised rest, ice, elevation, compression as needed for walking We will obtain x-ray to ensure no arthritis or other issues Advised quad strengthening Can consider Ortho or PT referral in the future if persists      Relevant Medications   traZODone (DESYREL) 50 MG tablet   FLUoxetine (PROZAC) 40 MG capsule   Other Relevant Orders   DG Knee Complete 4 Views Left (Completed)    Other Visit Diagnoses    Need for influenza vaccination       Relevant Orders   Flu Vaccine QUAD 36+ mos IM (Completed)       Return in about 3 months (around 12/01/2019) for CPE.   The entirety of the information documented in the History of Present Illness, Review of Systems and Physical Exam were personally obtained by me. Portions of this information were initially documented by Lynford Humphrey , CMA and reviewed by me for thoroughness and accuracy.    Sunjai Levandoski, Dionne Bucy, MD MPH [facility]

## 2019-08-31 NOTE — Assessment & Plan Note (Signed)
As above, patient is having transient neurologic symptoms including vision loss and hand weakness/spasm I suspect that these are somatic symptoms, especially as they improve with relaxation techniques, but she is the right demographic to possibly have multiple sclerosis, so will refer to neurology for further evaluation She is neuro intact on exam today and her psychiatric medications will be resumed as above

## 2019-08-31 NOTE — Assessment & Plan Note (Signed)
Discussed importance of healthy weight management Discussed diet and exercise  

## 2019-08-31 NOTE — Assessment & Plan Note (Signed)
Occasional nausea without other symptoms Suspect this is mild GERD as it occurs in the mornings Discussed lifestyle changes such as not eating for 2 hours before she goes to bed, staying well-hydrated, decreasing spicy/acidic foods, and staying upright after eating for at least 1 hour She can continue to use the OTC agent that she is using at this time Discussed return precautions

## 2019-08-31 NOTE — Telephone Encounter (Signed)
Faxed [facility] to fax # [phone]

## 2019-09-16 DIAGNOSIS — I8311 Varicose veins of right lower extremity with inflammation: Secondary | ICD-10-CM | POA: Diagnosis not present

## 2019-09-16 DIAGNOSIS — I8312 Varicose veins of left lower extremity with inflammation: Secondary | ICD-10-CM | POA: Diagnosis not present

## 2019-09-16 DIAGNOSIS — I83813 Varicose veins of bilateral lower extremities with pain: Secondary | ICD-10-CM | POA: Diagnosis not present

## 2019-09-25 DIAGNOSIS — I83813 Varicose veins of bilateral lower extremities with pain: Secondary | ICD-10-CM | POA: Diagnosis not present

## 2019-09-29 DIAGNOSIS — I8311 Varicose veins of right lower extremity with inflammation: Secondary | ICD-10-CM | POA: Diagnosis not present

## 2019-09-29 DIAGNOSIS — I8312 Varicose veins of left lower extremity with inflammation: Secondary | ICD-10-CM | POA: Diagnosis not present

## 2019-09-29 DIAGNOSIS — I83813 Varicose veins of bilateral lower extremities with pain: Secondary | ICD-10-CM | POA: Diagnosis not present

## 2019-10-21 DIAGNOSIS — I8311 Varicose veins of right lower extremity with inflammation: Secondary | ICD-10-CM | POA: Diagnosis not present

## 2019-11-09 DIAGNOSIS — I8311 Varicose veins of right lower extremity with inflammation: Secondary | ICD-10-CM | POA: Diagnosis not present

## 2019-11-09 DIAGNOSIS — I83811 Varicose veins of right lower extremities with pain: Secondary | ICD-10-CM | POA: Diagnosis not present

## 2019-11-23 DIAGNOSIS — M7981 Nontraumatic hematoma of soft tissue: Secondary | ICD-10-CM | POA: Diagnosis not present

## 2019-11-23 DIAGNOSIS — I8311 Varicose veins of right lower extremity with inflammation: Secondary | ICD-10-CM | POA: Diagnosis not present

## 2019-12-03 ENCOUNTER — Encounter: Payer: Self-pay | Admitting: Family Medicine

## 2019-12-07 DIAGNOSIS — I8312 Varicose veins of left lower extremity with inflammation: Secondary | ICD-10-CM | POA: Diagnosis not present

## 2019-12-07 DIAGNOSIS — I83812 Varicose veins of left lower extremities with pain: Secondary | ICD-10-CM | POA: Diagnosis not present

## 2019-12-10 ENCOUNTER — Ambulatory Visit: Payer: Self-pay | Admitting: Family Medicine

## 2019-12-10 ENCOUNTER — Telehealth: Payer: Self-pay | Admitting: Family Medicine

## 2019-12-10 DIAGNOSIS — I8312 Varicose veins of left lower extremity with inflammation: Secondary | ICD-10-CM | POA: Diagnosis not present

## 2019-12-10 NOTE — Progress Notes (Deleted)
Patient: Shelly Williams, Female    DOB: 1980/10/26, 39 y.o.   MRN: [id number] Visit Date: 12/10/2019  Today's Provider: Lavon Paganini, MD   No chief complaint on file.  Subjective:     Annual physical exam Shelly Williams is a 39 y.o. female who presents today for health maintenance and complete physical. She feels {DESC; WELL/FAIRLY WELL/POORLY:18703}. She reports exercising ***. She reports she is sleeping {DESC; WELL/FAIRLY WELL/POORLY:18703}.  -----------------------------------------------------------------  Follow up for Anxiety & Depression:   The patient was last seen for this 3 months ago. Changes made at last visit include will resume at previous doses Prozac, BuSpar, trazodone.  She reports good compliance with treatment. She feels that condition is {improved/worse/unchanged:3041574}. She is not having side effects.   ------------------------------------------------------------------------------------   Review of Systems  Constitutional: Negative.   HENT: Negative.   Eyes: Negative.   Respiratory: Negative.   Cardiovascular: Negative.   Gastrointestinal: Negative.   Endocrine: Negative.   Genitourinary: Negative.   Musculoskeletal: Negative.   Skin: Negative.   Allergic/Immunologic: Negative.   Neurological: Negative.   Hematological: Negative.   Psychiatric/Behavioral: Negative.     Social History      She  reports that she has never smoked. She has never used smokeless tobacco. She reports that she does not drink alcohol or use drugs.       Social History   Socioeconomic History  . Marital status: Married    Spouse name: Not on file  . Number of children: 4  . Years of education: Not on file  . Highest education level: Not on file  Occupational History  . Not on file  Tobacco Use  . Smoking status: Never Smoker  . Smokeless tobacco: Never Used  Substance and Sexual Activity  . Alcohol use: No  . Drug use: No  . Sexual activity:  Yes    Partners: Male    Birth control/protection: Surgical  Other Topics Concern  . Not on file  Social History Narrative  . Not on file   Social Determinants of Health   Financial Resource Strain:   . Difficulty of Paying Living Expenses: Not on file  Food Insecurity:   . Worried About Charity fundraiser in the Last Year: Not on file  . Ran Out of Food in the Last Year: Not on file  Transportation Needs:   . Lack of Transportation (Medical): Not on file  . Lack of Transportation (Non-Medical): Not on file  Physical Activity:   . Days of Exercise per Week: Not on file  . Minutes of Exercise per Session: Not on file  Stress:   . Feeling of Stress : Not on file  Social Connections:   . Frequency of Communication with Friends and Family: Not on file  . Frequency of Social Gatherings with Friends and Family: Not on file  . Attends Religious Services: Not on file  . Active Member of Clubs or Organizations: Not on file  . Attends Archivist Meetings: Not on file  . Marital Status: Not on file    Past Medical History:  Diagnosis Date  . Abdominal pain 2014  . Depression   . Gallstones 2014     Patient Active Problem List   Diagnosis Date Noted  . Class 2 obesity without serious comorbidity with body mass index (BMI) of 37.0 to 37.9 in adult 08/31/2019  . Nausea 08/31/2019  . GAD (generalized anxiety disorder) 08/31/2019  . Moderate episode of recurrent  major depressive disorder ([place]) 08/31/2019  . Multiple falls 08/31/2019  . Transient neurological symptoms 08/31/2019  . Chronic pain of left knee 08/31/2019    Past Surgical History:  Procedure Laterality Date  . CHOLECYSTECTOMY    . TUBAL LIGATION      Family History        Family Status  Relation Name Status  . Mat Aunt  (Not Specified)  . Other  (Not Specified)  . Mat Aunt  (Not Specified)  . Mother  Alive  . Father  Alive  . Sister  Alive  . Brother  Alive  . Daughter  Alive  . Son  Alive    . Daughter  Alive  . Daughter  Alive        Her family history includes Cancer in her maternal aunt, maternal aunt and another family member; Healthy in her brother, daughter, daughter, daughter, father, mother, sister, and son.      Allergies  Allergen Reactions  . Motrin [Ibuprofen] Rash     Current Outpatient Medications:  .  busPIRone (BUSPAR) 10 MG tablet, Take 1 tablet (10 mg total) by mouth 3 (three) times daily., Disp: 90 tablet, Rfl: 3 .  FLUoxetine (PROZAC) 40 MG capsule, Take 1 capsule (40 mg total) by mouth daily., Disp: 30 capsule, Rfl: 3 .  traZODone (DESYREL) 50 MG tablet, Take 1 tablet (50 mg total) by mouth at bedtime as needed for sleep., Disp: 30 tablet, Rfl: 3   Patient Care Team: Virginia Crews, MD as PCP - General (Family Medicine) Christene Lye, MD (General Surgery) Christene Lye, MD (General Surgery) Luna Glasgow, DO (Family Medicine)    Objective:    Vitals: There were no vitals taken for this visit.  There were no vitals filed for this visit.   Physical Exam   Depression Screen PHQ 2/9 Scores 08/31/2019  PHQ - 2 Score 2  PHQ- 9 Score 7       Assessment & Plan:     Routine Health Maintenance and Physical Exam  Exercise Activities and Dietary recommendations Goals   None     Immunization History  Administered Date(s) Administered  . Influenza,inj,Quad PF,6+ Mos 08/31/2019    Health Maintenance  Topic Date Due  . HIV Screening  02/07/1996  . TETANUS/TDAP  02/07/2000  . PAP SMEAR-Modifier  02/06/2002  . INFLUENZA VACCINE  Completed     Discussed health benefits of physical activity, and encouraged her to engage in regular exercise appropriate for her age and condition.    --------------------------------------------------------------------    Lavon Paganini, MD  [place]

## 2019-12-10 NOTE — Telephone Encounter (Signed)
Please rescheduled her CPE appointment.  However, she is needing a call back to ask some questions  regarding her medications for:  Sleepiness Anxiety  Pt didn't know the names of the medications.  Please call pt back at [phone].  Thanks, [employer]

## 2019-12-11 NOTE — Telephone Encounter (Signed)
LMTCB

## 2019-12-15 NOTE — Telephone Encounter (Signed)
LMTCB

## 2019-12-16 NOTE — Telephone Encounter (Signed)
Apt for 12/17/2019 at 11:20  Thanks,   -Mickel Baas

## 2019-12-16 NOTE — Telephone Encounter (Signed)
Needs to taper off of Prozac slowly. Recommend virtual visit to discuss the taper and what to expect. Can go ahead and stop buspirone currently.

## 2019-12-16 NOTE — Progress Notes (Signed)
Patient: Shelly Williams Female    DOB: 1981-05-26   39 y.o.   MRN: [id number] Visit Date: 12/17/2019  Today's Provider: Lavon Paganini, MD   Chief Complaint  Patient presents with  . Depression  . Anxiety   Subjective:    Virtual Visit via Telephone Note  I connected with Toni Arthurs on 12/17/19 at 11:20 AM EST by telephone and verified that I am speaking with the correct person using two identifiers.  Location: Patient location: home Provider location: [facility] Persons involved in the visit: patient, provider   I discussed the limitations, risks, security and privacy concerns of performing an evaluation and management service by telephone and the availability of in person appointments. I also discussed with the patient that there may be a patient responsible charge related to this service. The patient expressed understanding and agreed to proceed.   HPI  Pt would like to discuss tapering off Prozac and Buspar. Anxiety and depression are well controlled. No further sleeping difficulty.  Depression screen [facility] 2/9 12/17/2019 08/31/2019  Decreased Interest 0 1  Down, Depressed, Hopeless 0 1  PHQ - 2 Score 0 2  Altered sleeping - 1  Tired, decreased energy - 1  Change in appetite - 0  Feeling bad or failure about yourself  - 1  Trouble concentrating - 2  Moving slowly or fidgety/restless - 0  Suicidal thoughts - 0  PHQ-9 Score - 7  Difficult doing work/chores - Somewhat difficult     GAD 7 : Generalized Anxiety Score 12/17/2019 08/31/2019  Nervous, Anxious, on Edge 0 0  Control/stop worrying 0 1  Worry too much - different things 0 1  Trouble relaxing 0 1  Restless 0 0  Easily annoyed or irritable 0 1  Afraid - awful might happen 0 1  Total GAD 7 Score 0 5  Anxiety Difficulty Not difficult at all Somewhat difficult      Allergies  Allergen Reactions  . Motrin [Ibuprofen] Rash     Current Outpatient Medications:  .  busPIRone  (BUSPAR) 10 MG tablet, Take 1 tablet (10 mg total) by mouth 3 (three) times daily., Disp: 90 tablet, Rfl: 3 .  FLUoxetine (PROZAC) 10 MG capsule, Take 20mg  daily x2 weeks, then 10mg  daily x1wk, then 10mg  qod x1wk., Disp: 40 capsule, Rfl: 0  Review of Systems  Constitutional: Negative.   Psychiatric/Behavioral: Negative.     Social History   Tobacco Use  . Smoking status: Never Smoker  . Smokeless tobacco: Never Used  Substance Use Topics  . Alcohol use: No      Objective:   There were no vitals taken for this visit. There were no vitals filed for this visit.There is no height or weight on file to calculate BMI.   Physical Exam Speaking in full sentences in NAD  No results found for any visits on 12/17/19.     Assessment & Plan      Follow Up Instructions:    I discussed the assessment and treatment plan with the patient. The patient was provided an opportunity to ask questions and all were answered. The patient agreed with the plan and demonstrated an understanding of the instructions.   The patient was advised to call back or seek an in-person evaluation if the symptoms worsen or if the condition fails to improve as anticipated.  Problem List Items Addressed This Visit      Other   GAD (generalized anxiety disorder)    Well  controlled D/c buspar Start Prozac taper Discussed possible withdrawal symptoms Return precautions discussed      Relevant Medications   FLUoxetine (PROZAC) 10 MG capsule   MDD (major depressive disorder)    Well controlled In remission Slow taper of prozac Discussed possible withdrawal symptoms Return precautions discussed      Relevant Medications   FLUoxetine (PROZAC) 10 MG capsule      The entirety of the information documented in the History of Present Illness, Review of Systems and Physical Exam were personally obtained by me. Portions of this information were initially documented by Ashley Royalty, CMA and reviewed by me for  thoroughness and accuracy.    Timia Casselman, Dionne Bucy, MD MPH [facility]

## 2019-12-16 NOTE — Telephone Encounter (Signed)
Patient reports that she is feeling much better with depression and anxiety. Patient would like to discontinue prozac 40mg  and buspirone 10mg  TID. Patient would like to know if she can just stop medication or does she need to taper down? Please advise.

## 2019-12-17 ENCOUNTER — Ambulatory Visit (INDEPENDENT_AMBULATORY_CARE_PROVIDER_SITE_OTHER): Payer: BC Managed Care – PPO | Admitting: Family Medicine

## 2019-12-17 DIAGNOSIS — F3342 Major depressive disorder, recurrent, in full remission: Secondary | ICD-10-CM

## 2019-12-17 DIAGNOSIS — F411 Generalized anxiety disorder: Secondary | ICD-10-CM

## 2019-12-17 MED ORDER — FLUOXETINE HCL 10 MG PO CAPS: ORAL_CAPSULE | ORAL | 0 refills | Status: DC

## 2019-12-17 NOTE — Assessment & Plan Note (Signed)
Well controlled D/c buspar Start Prozac taper Discussed possible withdrawal symptoms Return precautions discussed

## 2019-12-17 NOTE — Assessment & Plan Note (Signed)
Well controlled In remission Slow taper of prozac Discussed possible withdrawal symptoms Return precautions discussed

## 2019-12-25 DIAGNOSIS — I8311 Varicose veins of right lower extremity with inflammation: Secondary | ICD-10-CM | POA: Diagnosis not present

## 2019-12-25 DIAGNOSIS — M7981 Nontraumatic hematoma of soft tissue: Secondary | ICD-10-CM | POA: Diagnosis not present

## 2019-12-30 DIAGNOSIS — I83812 Varicose veins of left lower extremities with pain: Secondary | ICD-10-CM | POA: Diagnosis not present

## 2019-12-30 DIAGNOSIS — I8312 Varicose veins of left lower extremity with inflammation: Secondary | ICD-10-CM | POA: Diagnosis not present

## 2020-01-14 ENCOUNTER — Other Ambulatory Visit: Payer: Self-pay | Admitting: Family Medicine

## 2020-01-14 NOTE — Telephone Encounter (Signed)
Requested  medications are  due for refill today yes  Requested medications are on the active medication list yes  Last refill 3/11  Future visit scheduled no  Notes to clinic - Looks like pt was to taper off, however it is still on current med-list.

## 2020-01-18 ENCOUNTER — Encounter: Payer: Self-pay | Admitting: Family Medicine

## 2020-01-18 ENCOUNTER — Ambulatory Visit (INDEPENDENT_AMBULATORY_CARE_PROVIDER_SITE_OTHER): Payer: BC Managed Care – PPO | Admitting: Family Medicine

## 2020-01-18 ENCOUNTER — Other Ambulatory Visit: Payer: Self-pay

## 2020-01-18 ENCOUNTER — Other Ambulatory Visit (HOSPITAL_COMMUNITY)
Admission: RE | Admit: 2020-01-18 | Discharge: 2020-01-18 | Disposition: A | Discharge status: Home / Self Care | Payer: BC Managed Care – PPO | Source: Ambulatory Visit | Attending: Family Medicine | Admitting: Family Medicine

## 2020-01-18 VITALS — BP 92/62 | HR 77 | Temp 97.3°F | Resp 16 | Ht 62.0 in | Wt 207.0 lb

## 2020-01-18 DIAGNOSIS — Z114 Encounter for screening for human immunodeficiency virus [HIV]: Secondary | ICD-10-CM

## 2020-01-18 DIAGNOSIS — N393 Stress incontinence (female) (male): Secondary | ICD-10-CM | POA: Diagnosis not present

## 2020-01-18 DIAGNOSIS — E669 Obesity, unspecified: Secondary | ICD-10-CM | POA: Diagnosis not present

## 2020-01-18 DIAGNOSIS — Z124 Encounter for screening for malignant neoplasm of cervix: Secondary | ICD-10-CM | POA: Insufficient documentation

## 2020-01-18 DIAGNOSIS — Z0000 Encounter for general adult medical examination without abnormal findings: Secondary | ICD-10-CM | POA: Diagnosis not present

## 2020-01-18 DIAGNOSIS — N8110 Cystocele, unspecified: Secondary | ICD-10-CM

## 2020-01-18 DIAGNOSIS — Z6837 Body mass index (BMI) 37.0-37.9, adult: Secondary | ICD-10-CM

## 2020-01-18 NOTE — Patient Instructions (Signed)
Incontinencia urinaria Urinary Incontinence  La incontinencia urinaria es una afeccin en la cual una persona no puede controlar dnde y Financial risk analyst. Ardelia Mems persona con esta afeccin orinar en un momento y Engineer, civil (consulting) no deseados (involuntariamente). Cules son las causas? Esta afeccin puede ser causada por lo siguiente:  Medicamentos.  Infecciones.  Estreimiento.  Msculos de la vejiga hiperactivos.  Msculos de la vejiga dbiles.  Msculos del suelo de la pelvis dbiles. Estos msculos proporcionan apoyo a la vejiga, el intestino y el tero, en el caso de las [place].  Aumento del tamao de la prstata en los hombres. La prstata es una glndula cerca de la vejiga. Cuando se [place], puede comprimir la [place]. Si la uretra est obstruida, la vejiga puede debilitarse y perder la capacidad para vaciarse correctamente.  Ciruga.  Factores emocionales, como la ansiedad, el estrs o el trastorno de estrs postraumtico (TEP).  Prolapso de los rganos de la pelvis. Esto ocurre en las mujeres, cuando los rganos se salen de Environmental consultant, hacia la vagina. Este cambio de lugar puede evitar que la vejiga y la uretra funcionen correctamente. Qu incrementa el riesgo? Los siguientes factores pueden hacer que usted sea ms propenso a Best boy esta afeccin:  Edad avanzada.  Obesidad e inactividad fsica.  Embarazo y [place].  Menopausia.  Las enfermedades que [employer] nervios o la mdula espinal (enfermedades neurolgicas).  Tos de larga duracin (crnica). Esto puede aumentar la presin en los msculos de la vejiga y del suelo plvico. Cules son los signos o los sntomas? Los sntomas pueden variar, segn el tipo de incontinencia urinaria que tenga. Estos incluyen los siguientes:  Una necesidad repentina de Garment/textile technologist, y orinar involuntariamente antes de llegar al bao (incontinencia imperiosa).  Orinar repentinamente al realizar una actividad que provoque la miccin, como toser, rer,  Field seismologist ejercicio o estornudar (incontinencia urinaria de esfuerzo).  Tener ganas de orinar con frecuencia, y Garment/textile technologist en pequeas cantidades o por goteo (incontinencia por rebosamiento).  Orinar porque no puede llegar al bao a tiempo a causa de una discapacidad fsica, como la artritis o una lesin, o de afecciones comunicativas o cognitivas, como la enfermedad de Alzheimer (incontinencia funcional). Cmo se diagnostica? Esta afeccin se puede diagnosticar en funcin de lo siguiente:  Sus antecedentes mdicos.  Un examen fsico.  Estudios, como por ejemplo: ? [place] de [place]. ? Radiografas del rin y de la vejiga. ? Ecografa. ? Exploracin por tomografa computarizada (TC). ? Cistoscopia. En este procedimiento, el mdico inserta un tubo con Ardelia Mems luz y Carlota Raspberry (cistoscopio) por la uretra hacia la vejiga para Education officer, environmental. ? Estudios urodinmicos. Estos estudios evalan la capacidad de la vejiga, de la uretra y del esfnter para Financial controller y Radio broadcast assistant orina. Hay distintos tipos de estudios urodinmicos, que pueden variar segn lo que Geophysical data processor. Para ayudar a diagnosticar la afeccin, el mdico puede recomendarle que mantenga un registro de la frecuencia con la que [place] y de la cantidad de [place]. Cmo se trata? El tratamiento de esta afeccin depende del tipo de incontinencia que tenga y de su causa. El tratamiento puede incluir lo siguiente:  Cambios en el estilo de vida, como por ejemplo: ? Dejar de fumar. ? Mantener un peso saludable. ? Mantenerse activo. Trate de hacer 156mnutos de ejercicio de intensidad moderada cada semana. Pregntele al mdico qu actividades son seguras para usted. ? Consumir una dieta saludable.  Evite los alimentos con alto contenido de grasa, como los alimentos fritos.  Reduzca el consumo de carbohidratos refinados,  como el pan blanco y el arroz blanco.  Limite la cantidad de cafena que consume.  Aumente el consumo de [place].  Los alimentos como frutas y verduras frescas, los frijoles y los cereales integrales son fuentes saludables de [place].  Ejercicios para el msculo del piso plvico.  Ejercitacin de la vejiga, como prolongar la cantidad de [employer] las idas al bao o ir al bao en intervalos regulares.  Aplicar tcnicas para reducir las urgencias de la vejiga. Esto puede incluir tcnicas de distraccin o ejercicios de respiracin controlada.  Medicamentos para Media planner los msculos de la vejiga y Product/process development scientist los espasmos de la vejiga.  Medicamentos para disminuir o Geographical information systems officer de la prstata, en el caso de los hombres.  Inyecciones de btox. Esto puede ayudar a [employer] de la vejiga.  Usar pulsos elctricos para alterar los reflejos de la vejiga (estimulacin nerviosa elctrica).  En el caso de las [place] un dispositivo mdico para evitar prdidas de [place]. Este es un dispositivo pequeo y Insurance risk surveyor, parecido a un tampn, que se inserta en la uretra.  Inyectar colgeno o perlas de carbono (espesantes) en el esfnter urinario. Esto puede ayudar a espesar el tejido y cerrar la abertura de la vejiga.  Ciruga. Siga estas indicaciones en su casa: Estilo de vida  Limite el consumo de alcohol y cafena. Estos pueden llenar la vejiga rpidamente e irritarla.  Mantenga su higiene personal para evitar malos olores y lesiones cutneas. Pregntele al mdico acerca de cremas y productos para la piel que puedan proteger la piel de la [place].  Considere usar compresas o paales para adultos. Asegrese de cambiarlos con regularidad y siempre cmbielos tras un episodio de incontinencia. Instrucciones generales  [employer] de venta libre y los recetados solamente como se lo haya indicado el mdico.  Trate de ir al bao cada 3 o 4 horas, aunque no sienta la necesidad de Garment/textile technologist. Tmese el tiempo para vaciar la vejiga completamente cada vez que orine. Despus de orinar, espere un  minuto. Luego trate de orinar nuevamente.  Adopte una posicin cmoda para orinar.  Si la causa de su incontinencia son problemas nerviosos, lleve un registro de los medicamentos que toma y de las veces que vaya al bao.  Concurra a todas las visitas de seguimiento como se lo haya indicado el mdico. Esto es importante. Comunquese con un mdico si:  Siente un dolor que empeora.  La incontinencia empeora. Solicite ayuda de inmediato si:  Tiene fiebre o siente escalofros.  No puede orinar.  Tiene enrojecimiento en la zona de la ingle o en las piernas. Resumen  La incontinencia urinaria es una afeccin en la cual una persona no puede controlar dnde y Financial risk analyst.  Esta afeccin puede ser causada por medicamentos, infecciones, debilidad en los msculos de la vejiga, debilidad en los msculos del suelo plvico, agrandamiento de la prstata (en los hombres) o [place].  Los siguientes factores aumentan el riesgo de tener esta afeccin: Salena Saner, obesidad, [place], menopausia, enfermedades neurolgicas y tos crnica.  Hay muchos tipos de incontinencia urinaria. Estos incluyen incontinencia imperiosa, incontinencia urinaria de esfuerzo, incontinencia por rebosamiento e incontinencia funcional.  Por lo general, esta afeccin se trata primero con cambios en el estilo de vida y el comportamiento, como dejar de fumar, adoptar una alimentacin saludable y Field seismologist ejercicios para el suelo plvico con regularidad. Otras opciones de tratamiento incluyen medicamentos, espesantes, dispositivos mdicos, neuroestimulacin elctrica o ciruga. Esta informacin no tiene Marine scientist  el consejo del mdico. Asegrese de hacerle al mdico cualquier pregunta que tenga. Document Revised: 10/04/2017 Document Reviewed: 04/30/2017 Elsevier Patient Education  [place] 26-64 Years Old, Female Preventive care refers to visits with your health care provider and lifestyle  choices that can promote health and wellness. This includes:  A yearly physical exam. This may also be called an annual well check.  Regular dental visits and eye exams.  Immunizations.  Screening for certain conditions.  Healthy lifestyle choices, such as eating a healthy diet, getting regular exercise, not using drugs or products that contain nicotine and tobacco, and limiting alcohol use. What can I expect for my preventive care visit? Physical exam Your health care provider will check your:  Height and weight. This may be used to calculate body mass index (BMI), which tells if you are at a healthy weight.  Heart rate and blood pressure.  Skin for abnormal spots. Counseling Your health care provider may ask you questions about your:  Alcohol, tobacco, and drug use.  Emotional well-being.  Home and relationship well-being.  Sexual activity.  Eating habits.  Work and work Statistician.  Method of birth control.  Menstrual cycle.  Pregnancy history. What immunizations do I need?  Influenza (flu) vaccine  This is recommended every year. Tetanus, diphtheria, and pertussis (Tdap) vaccine  You may need a Td booster every 10 years. Varicella (chickenpox) vaccine  You may need this if you have not been vaccinated. Human papillomavirus (HPV) vaccine  If recommended by your health care provider, you may need three doses over 6 months. Measles, mumps, and rubella (MMR) vaccine  You may need at least one dose of MMR. You may also need a second dose. Meningococcal conjugate (MenACWY) vaccine  One dose is recommended if you are age 43-21 years and a first-year college student living in a residence hall, or if you have one of several medical conditions. You may also need additional booster doses. Pneumococcal conjugate (PCV13) vaccine  You may need this if you have certain conditions and were not previously vaccinated. Pneumococcal polysaccharide (PPSV23)  vaccine  You may need one or two doses if you smoke cigarettes or if you have certain conditions. Hepatitis A vaccine  You may need this if you have certain conditions or if you travel or work in places where you may be exposed to hepatitis A. Hepatitis B vaccine  You may need this if you have certain conditions or if you travel or work in places where you may be exposed to hepatitis B. Haemophilus influenzae type b (Hib) vaccine  You may need this if you have certain conditions. You may receive vaccines as individual doses or as more than one vaccine together in one shot (combination vaccines). Talk with your health care provider about the risks and benefits of combination vaccines. What tests do I need?  Blood tests  Lipid and cholesterol levels. These may be checked every 5 years starting at age 12.  Hepatitis C test.  Hepatitis B test. Screening  Diabetes screening. This is done by checking your blood sugar (glucose) after you have not eaten for a while (fasting).  Sexually transmitted disease (STD) testing.  BRCA-related cancer screening. This may be done if you have a family history of breast, ovarian, tubal, or peritoneal cancers.  Pelvic exam and Pap test. This may be done every 3 years starting at age 36. Starting at age 43, this may be done every 5 years if you have a Pap  test in combination with an HPV test. Talk with your health care provider about your test results, treatment options, and if necessary, the need for more tests. Follow these instructions at home: Eating and drinking   Eat a diet that includes fresh fruits and vegetables, whole grains, lean protein, and low-fat dairy.  Take vitamin and mineral supplements as recommended by your health care provider.  Do not drink alcohol if: ? Your health care provider tells you not to drink. ? You are pregnant, may be pregnant, or are planning to become pregnant.  If you drink alcohol: ? Limit how much you have  to 0-1 drink a day. ? Be aware of how much alcohol is in your drink. In the [place] one drink equals one 12 oz bottle of beer (355 mL), one 5 oz glass of wine (148 mL), or one 1 oz glass of hard liquor (44 mL). Lifestyle  Take daily care of your teeth and gums.  Stay active. Exercise for at least 30 minutes on 5 or more days each week.  Do not use any products that contain nicotine or tobacco, such as cigarettes, e-cigarettes, and chewing tobacco. If you need help quitting, ask your health care provider.  If you are sexually active, practice safe sex. Use a condom or other form of birth control (contraception) in order to prevent pregnancy and STIs (sexually transmitted infections). If you plan to become pregnant, see your health care provider for a preconception visit. What's next?  Visit your health care provider once a year for a well check visit.  Ask your health care provider how often you should have your eyes and teeth checked.  Stay up to date on all vaccines. This information is not intended to replace advice given to you by your health care provider. Make sure you discuss any questions you have with your health care provider. Document Revised: 06/05/2018 Document Reviewed: 06/05/2018 Elsevier Patient Education  2020 [employer].

## 2020-01-18 NOTE — Progress Notes (Signed)
Patient: Shelly Williams, Female    DOB: 1981-09-17, 39 y.o.   MRN: [id number] Visit Date: 01/18/2020    I,Shelly Williams,acting as a scribe for Shelly Paganini, MD.,have documented all relevant documentation on the behalf of Shelly Paganini, MD,as directed by  Shelly Paganini, MD while in the presence of Shelly Paganini, MD.  [phone] Provider: Lavon Paganini, MD   Chief Complaint  Patient presents with  . Annual Exam   Subjective:     Annual physical exam Shelly Williams is a 39 y.o. female who presents today for health maintenance and complete physical. She feels well. She reports exercising yes. She reports she is sleeping well.  ----------------------------------------------------------------- Stress urinary incontinence Worse after walking Empties bladder before walking Changing to cotton underwear helps Ongoing for 2 yrs but getting worse  Review of Systems  Constitutional: Negative.   HENT: Negative.   Eyes: Negative.   Respiratory: Negative.   Cardiovascular: Negative.   Gastrointestinal: Negative.   Endocrine: Negative.   Genitourinary: Positive for enuresis.  Musculoskeletal: Negative.   Skin: Negative.   Allergic/Immunologic: Negative.   Neurological: Negative.   Hematological: Negative.   Psychiatric/Behavioral: Negative.     Social History      She  reports that she has never smoked. She has never used smokeless tobacco. She reports that she does not drink alcohol or use drugs.       Social History   Socioeconomic History  . Marital status: Married    Spouse name: Not on file  . Number of children: 4  . Years of education: Not on file  . Highest education level: Not on file  Occupational History  . Not on file  Tobacco Use  . Smoking status: Never Smoker  . Smokeless tobacco: Never Used  Substance and Sexual Activity  . Alcohol use: No  . Drug use: No  . Sexual activity: Yes    Partners: Male    Birth control/protection:  Surgical  Other Topics Concern  . Not on file  Social History Narrative  . Not on file   Social Determinants of Health   Financial Resource Strain:   . Difficulty of Paying Living Expenses:   Food Insecurity:   . Worried About Charity fundraiser in the Last Year:   . Arboriculturist in the Last Year:   Transportation Needs:   . Film/video editor (Medical):   Marland Kitchen Lack of Transportation (Non-Medical):   Physical Activity:   . Days of Exercise per Week:   . Minutes of Exercise per Session:   Stress:   . Feeling of Stress :   Social Connections:   . Frequency of Communication with Friends and Family:   . Frequency of Social Gatherings with Friends and Family:   . Attends Religious Services:   . Active Member of Clubs or Organizations:   . Attends Archivist Meetings:   Marland Kitchen Marital Status:     Past Medical History:  Diagnosis Date  . Abdominal pain 2014  . Depression   . Gallstones 2014     Patient Active Problem List   Diagnosis Date Noted  . Class 2 obesity without serious comorbidity with body mass index (BMI) of 37.0 to 37.9 in adult 08/31/2019  . Nausea 08/31/2019  . GAD (generalized anxiety disorder) 08/31/2019  . MDD (major depressive disorder) 08/31/2019  . Multiple falls 08/31/2019  . Transient neurological symptoms 08/31/2019  . Chronic pain of left knee 08/31/2019  Past Surgical History:  Procedure Laterality Date  . CHOLECYSTECTOMY    . TUBAL LIGATION      Family History        Family Status  Relation Name Status  . Mat Aunt  (Not Specified)  . Other  (Not Specified)  . Mat Aunt  (Not Specified)  . Mother  Alive  . Father  Alive  . Sister  Alive  . Brother  Alive  . Daughter  Alive  . Son  Alive  . Daughter  Alive  . Daughter  Alive        Her family history includes Cancer in her maternal aunt, maternal aunt and another family member; Healthy in her brother, daughter, daughter, daughter, father, mother, sister, and son.       Allergies  Allergen Reactions  . Motrin [Ibuprofen] Rash     Current Outpatient Medications:  .  FLUoxetine (PROZAC) 10 MG capsule, TAKE 2 CAPSULES BY MOUTH DAILY FOR 2 WEEKS THEN TAKE ONE CAPSULE DAILY FOR ONE WEEK THEN TAKE ONE CAPSULE EVERY OTHER DAU FOR 1 WEEK (Patient not taking: Reported on 01/18/2020), Disp: 40 capsule, Rfl: 0   Patient Care Team: Virginia Crews, MD as PCP - General (Family Medicine) Christene Lye, MD (General Surgery) Christene Lye, MD (General Surgery) Luna Glasgow, DO (Family Medicine)    Objective:    Vitals: BP 92/62 (BP Location: Left Arm, Patient Position: Sitting, Cuff Size: Large)   Pulse 77   Temp (!) 97.3 F (36.3 C) (Temporal)   Resp 16   Ht 5\' 2"  (1.575 m)   Wt 207 lb (93.9 kg)   LMP 12/30/2019 (Exact Date)   BMI 37.86 kg/m    Vitals:   01/18/20 1536  BP: 92/62  Pulse: 77  Resp: 16  Temp: (!) 97.3 F (36.3 C)  TempSrc: Temporal  Weight: 207 lb (93.9 kg)  Height: 5\' 2"  (1.575 m)     Physical Exam Vitals reviewed.  Constitutional:      General: She is not in acute distress.    Appearance: Normal appearance. She is well-developed. She is not diaphoretic.  HENT:     Head: Normocephalic and atraumatic.     Right Ear: Tympanic membrane, ear canal and external ear normal.     Left Ear: Tympanic membrane, ear canal and external ear normal.  Eyes:     General: No scleral icterus.    Conjunctiva/sclera: Conjunctivae normal.     Pupils: Pupils are equal, round, and reactive to light.  Neck:     Thyroid: No thyromegaly.  Cardiovascular:     Rate and Rhythm: Normal rate and regular rhythm.     Pulses: Normal pulses.     Heart sounds: Normal heart sounds. No murmur.  Pulmonary:     Effort: Pulmonary effort is normal. No respiratory distress.     Breath sounds: Normal breath sounds. No wheezing or rales.  Abdominal:     General: There is no distension.     Palpations: Abdomen is soft.      Tenderness: There is no abdominal tenderness.  Genitourinary:    Comments: GYN:  External genitalia within normal limits.  Vaginal mucosa pink, moist, normal rugae.  Nonfriable cervix without lesions, no discharge or bleeding noted on speculum exam.  Bimanual exam revealed normal, nongravid uterus.  No cervical motion tenderness. No adnexal masses bilaterally.   Cystocele noted with pressure Musculoskeletal:        General: No deformity.  Cervical back: Neck supple.     Right lower leg: No edema.     Left lower leg: No edema.  Lymphadenopathy:     Cervical: No cervical adenopathy.  Skin:    General: Skin is warm and dry.     Capillary Refill: Capillary refill takes less than 2 seconds.     Findings: No rash.  Neurological:     Mental Status: She is alert and oriented to person, place, and time. Mental status is at baseline.  Psychiatric:        Mood and Affect: Mood normal.        Behavior: Behavior normal.        Thought Content: Thought content normal.      Depression Screen PHQ 2/9 Scores 01/18/2020 12/17/2019 08/31/2019  PHQ - 2 Score 0 0 2  PHQ- 9 Score 0 - 7   GAD 7 : Generalized Anxiety Score 12/17/2019 08/31/2019  Nervous, Anxious, on Edge 0 0  Control/stop worrying 0 1  Worry too much - different things 0 1  Trouble relaxing 0 1  Restless 0 0  Easily annoyed or irritable 0 1  Afraid - awful might happen 0 1  Total GAD 7 Score 0 5  Anxiety Difficulty Not difficult at all Somewhat difficult         Assessment & Plan:     Routine Health Maintenance and Physical Exam  Exercise Activities and Dietary recommendations Goals   None     Immunization History  Administered Date(s) Administered  . Influenza,inj,Quad PF,6+ Mos 08/31/2019    Health Maintenance  Topic Date Due  . HIV Screening  Never done  . TETANUS/TDAP  Never done  . PAP SMEAR-Modifier  Never done  . INFLUENZA VACCINE  05/08/2020     Discussed health benefits of physical activity, and  encouraged her to engage in regular exercise appropriate for her age and condition.    --------------------------------------------------------------------  Problem List Items Addressed This Visit      Other   Class 2 obesity without serious comorbidity with body mass index (BMI) of 37.0 to 37.9 in adult    Discussed importance of healthy weight management Discussed diet and exercise       Relevant Orders   CBC with Differential/Platelet   Lipid Panel With LDL/HDL Ratio   Comprehensive metabolic panel   TSH   Stress incontinence    Ongoing and worsening problem over several years H/o 4 vaginal deliveries Now occurring with walking Discussed incontinence pads and tampons Patient prefers to see GYN for possible pessary Also discussed pelvic floor PT- patient declines       Other Visit Diagnoses    Annual physical exam    -  Primary   Relevant Orders   CBC with Differential/Platelet   Lipid Panel With LDL/HDL Ratio   Comprehensive metabolic panel   TSH   Encounter for screening for HIV       Relevant Orders   HIV Antibody (routine testing w rflx)   Cervical cancer screening       Relevant Orders   Cytology - PAP   Cystocele without uterine prolapse       Relevant Orders   Ambulatory referral to Gynecology       Return in about 1 year (around 01/17/2021) for CPE.   The entirety of the information documented in the History of Present Illness, Review of Systems and Physical Exam were personally obtained by me. Portions of this information were initially documented  by Lynford Humphrey, CMA and reviewed by me for thoroughness and accuracy.    Kaylor Maiers, Dionne Bucy, MD MPH [facility]

## 2020-01-19 ENCOUNTER — Telehealth: Payer: Self-pay | Admitting: Obstetrics & Gynecology

## 2020-01-19 DIAGNOSIS — N393 Stress incontinence (female) (male): Secondary | ICD-10-CM | POA: Insufficient documentation

## 2020-01-19 NOTE — Assessment & Plan Note (Signed)
Discussed importance of healthy weight management Discussed diet and exercise  

## 2020-01-19 NOTE — Assessment & Plan Note (Signed)
Ongoing and worsening problem over several years H/o 4 vaginal deliveries Now occurring with walking Discussed incontinence pads and tampons Patient prefers to see GYN for possible pessary Also discussed pelvic floor PT- patient declines

## 2020-01-19 NOTE — Telephone Encounter (Signed)
[facility] referring for Cystocele without uterine prolapseCystocele without uterine prolapse. Called and left voicemail for patient to call back to be schedule

## 2020-01-21 ENCOUNTER — Telehealth: Payer: Self-pay

## 2020-01-21 LAB — CYTOLOGY - PAP
Chlamydia: NEGATIVE
Comment: NEGATIVE
Comment: NEGATIVE
Comment: NORMAL
Diagnosis: NEGATIVE
High risk HPV: NEGATIVE
Neisseria Gonorrhea: NEGATIVE

## 2020-01-21 NOTE — Telephone Encounter (Signed)
-----   Message from Virginia Crews, MD sent at 01/21/2020  8:23 AM EDT ----- Normal pap smear Repeat in 3-5 years

## 2020-01-21 NOTE — Telephone Encounter (Signed)
Patient advised as below. Patient verbalizes understanding and is in agreement with treatment plan.  

## 2020-01-28 DIAGNOSIS — Z114 Encounter for screening for human immunodeficiency virus [HIV]: Secondary | ICD-10-CM | POA: Diagnosis not present

## 2020-01-28 DIAGNOSIS — Z0000 Encounter for general adult medical examination without abnormal findings: Secondary | ICD-10-CM | POA: Diagnosis not present

## 2020-01-28 DIAGNOSIS — E669 Obesity, unspecified: Secondary | ICD-10-CM | POA: Diagnosis not present

## 2020-01-28 DIAGNOSIS — Z6837 Body mass index (BMI) 37.0-37.9, adult: Secondary | ICD-10-CM | POA: Diagnosis not present

## 2020-01-29 ENCOUNTER — Telehealth: Payer: Self-pay

## 2020-01-29 LAB — COMPREHENSIVE METABOLIC PANEL
ALT: 19 IU/L (ref 0–32)
AST: 18 IU/L (ref 0–40)
Albumin/Globulin Ratio: 1.6 (ref 1.2–2.2)
Albumin: 3.9 g/dL (ref 3.8–4.8)
Alkaline Phosphatase: 77 IU/L (ref 39–117)
BUN/Creatinine Ratio: 18 (ref 9–23)
BUN: 10 mg/dL (ref 6–20)
Bilirubin Total: 0.7 mg/dL (ref 0.0–1.2)
CO2: 20 mmol/L (ref 20–29)
Calcium: 8.8 mg/dL (ref 8.7–10.2)
Chloride: 107 mmol/L — ABNORMAL HIGH (ref 96–106)
Creatinine, Ser: 0.57 mg/dL (ref 0.57–1.00)
GFR calc Af Amer: 136 mL/min/{1.73_m2} (ref >59)
GFR calc non Af Amer: 118 mL/min/{1.73_m2} (ref >59)
Globulin, Total: 2.4 g/dL (ref 1.5–4.5)
Glucose: 88 mg/dL (ref 65–99)
Potassium: 3.9 mmol/L (ref 3.5–5.2)
Sodium: 140 mmol/L (ref 134–144)
Total Protein: 6.3 g/dL (ref 6.0–8.5)

## 2020-01-29 LAB — CBC WITH DIFFERENTIAL/PLATELET
Basophils Absolute: 0.1 10*3/uL (ref 0.0–0.2)
Basos: 1 %
EOS (ABSOLUTE): 0.3 10*3/uL (ref 0.0–0.4)
Eos: 5 %
Hematocrit: 37.4 % (ref 34.0–46.6)
Hemoglobin: 12.1 g/dL (ref 11.1–15.9)
Immature Grans (Abs): 0 10*3/uL (ref 0.0–0.1)
Immature Granulocytes: 0 %
Lymphocytes Absolute: 2 10*3/uL (ref 0.7–3.1)
Lymphs: 32 %
MCH: 27.5 pg (ref 26.6–33.0)
MCHC: 32.4 g/dL (ref 31.5–35.7)
MCV: 85 fL (ref 79–97)
Monocytes Absolute: 0.6 10*3/uL (ref 0.1–0.9)
Monocytes: 10 %
Neutrophils Absolute: 3.3 10*3/uL (ref 1.4–7.0)
Neutrophils: 52 %
Platelets: 151 10*3/uL (ref 150–450)
RBC: 4.4 x10E6/uL (ref 3.77–5.28)
RDW: 13.2 % (ref 11.7–15.4)
WBC: 6.3 10*3/uL (ref 3.4–10.8)

## 2020-01-29 LAB — LIPID PANEL WITH LDL/HDL RATIO
Cholesterol, Total: 139 mg/dL (ref 100–199)
HDL: 50 mg/dL (ref >39)
LDL Chol Calc (NIH): 78 mg/dL (ref 0–99)
LDL/HDL Ratio: 1.6 ratio (ref 0.0–3.2)
Triglycerides: 50 mg/dL (ref 0–149)
VLDL Cholesterol Cal: 11 mg/dL (ref 5–40)

## 2020-01-29 LAB — TSH: TSH: 1.6 u[IU]/mL (ref 0.450–4.500)

## 2020-01-29 LAB — HIV ANTIBODY (ROUTINE TESTING W REFLEX): HIV Screen 4th Generation wRfx: NONREACTIVE

## 2020-01-29 NOTE — Telephone Encounter (Signed)
-----   Message from Virginia Crews, MD sent at 01/29/2020  9:23 AM EDT ----- Normal labs

## 2020-01-29 NOTE — Telephone Encounter (Signed)
Pt advised.   Thanks,   -Zanobia Griebel  

## 2020-02-03 ENCOUNTER — Encounter: Payer: Self-pay | Admitting: Obstetrics & Gynecology

## 2020-02-10 DIAGNOSIS — I83812 Varicose veins of left lower extremities with pain: Secondary | ICD-10-CM | POA: Diagnosis not present

## 2020-02-10 DIAGNOSIS — I8312 Varicose veins of left lower extremity with inflammation: Secondary | ICD-10-CM | POA: Diagnosis not present

## 2020-02-15 ENCOUNTER — Encounter: Payer: Self-pay | Admitting: Obstetrics & Gynecology

## 2020-02-15 ENCOUNTER — Encounter: Payer: Medicaid Other | Admitting: Obstetrics and Gynecology

## 2020-02-15 ENCOUNTER — Ambulatory Visit: Payer: Medicaid Other | Admitting: Obstetrics & Gynecology

## 2020-02-15 ENCOUNTER — Other Ambulatory Visit: Payer: Self-pay

## 2020-02-15 ENCOUNTER — Encounter: Payer: Self-pay | Admitting: Obstetrics and Gynecology

## 2020-02-15 VITALS — BP 120/80 | Ht 62.0 in | Wt 210.0 lb

## 2020-02-15 DIAGNOSIS — N8111 Cystocele, midline: Secondary | ICD-10-CM | POA: Diagnosis not present

## 2020-02-15 DIAGNOSIS — N393 Stress incontinence (female) (male): Secondary | ICD-10-CM

## 2020-02-15 NOTE — Progress Notes (Signed)
Gynecology Evaluation   Chief Complaint: Leakage of urine  History of Present Illness:   Patient is a 39 y.o. [id number], presents today for a problem visit.  The patient is not menopausal. She complains of urinary symptoms of urinary incontinence. Patient reports her symptoms began one year ago and have been worsening; and its severity is described as severe.  Her symptoms include incontinence and nocturnal enuresis.  She is having leakage of urine with stressful activities, such as coughing, sneezing, laughter, or exercise.  She is not having leakage of urine based on symptoms of urgency or bladder spasm/discomfort.  Denies Freq and Urgency. Pos Odor, embarrassing. She is not having nocturia, with 1 episodes per night.  She is using pads or diapers daily for control of symptoms. She is not having dysuria.  She has not had a history of UTI.  She drinks about 1 caffeinated drinks per day.  She has not had prior procedures for incontinence.  PMHx: She  has a past medical history of Abdominal pain (2014), Depression, and Gallstones (2014). Also,  has a past surgical history that includes Cholecystectomy and Tubal ligation., family history includes Cancer in her maternal aunt, maternal aunt and another family member; Healthy in her brother, daughter, daughter, daughter, father, mother, sister, and son.,  reports that she has never smoked. She has never used smokeless tobacco. She reports that she does not drink alcohol or use drugs.  She currently has no medications in their medication list. Also, is allergic to motrin [ibuprofen].  Review of Systems  Constitutional: Negative for chills, fever and malaise/fatigue.  HENT: Negative for congestion, sinus pain and sore throat.   Eyes: Negative for blurred vision and pain.  Respiratory: Negative for cough and wheezing.   Cardiovascular: Negative for chest pain and leg swelling.  Gastrointestinal: Negative for abdominal pain, constipation, diarrhea,  heartburn, nausea and vomiting.  Genitourinary: Negative for dysuria, frequency, hematuria and urgency.  Musculoskeletal: Negative for back pain, joint pain, myalgias and neck pain.  Skin: Negative for itching and rash.  Neurological: Negative for dizziness, tremors and weakness.  Endo/Heme/Allergies: Does not bruise/bleed easily.  Psychiatric/Behavioral: Negative for depression. The patient is not nervous/anxious and does not have insomnia.     Objective: BP 120/80   Ht 5\' 2"  (1.575 m)   Wt 210 lb (95.3 kg)   LMP 02/03/2020   BMI 38.41 kg/m  Physical Exam Constitutional:      General: She is not in acute distress.    Appearance: She is well-developed.  Genitourinary:     Pelvic exam was performed with patient supine.     Vagina and uterus normal.     No vaginal erythema or bleeding.     No cervical motion tenderness, discharge, polyp or nabothian cyst.     Uterus is mobile.     Uterus is not enlarged.     No uterine mass detected.    Uterus is midaxial.     No right or left adnexal mass present.     Right adnexa not tender.     Left adnexa not tender.     Genitourinary Comments: Urethral hypermobility, >30 degrees Small well supported uterus Cystocele gr1  HENT:     Head: Normocephalic and atraumatic.     Nose: Nose normal.  Abdominal:     General: There is no distension.     Palpations: Abdomen is soft.     Tenderness: There is no abdominal tenderness.  Musculoskeletal:  General: Normal range of motion.  Neurological:     Mental Status: She is alert and oriented to person, place, and time.     Cranial Nerves: No cranial nerve deficit.  Skin:    General: Skin is warm and dry.  Psychiatric:        Attention and Perception: Attention normal.        Mood and Affect: Mood and affect normal.        Speech: Speech normal.        Behavior: Behavior normal.        Thought Content: Thought content normal.        Judgment: Judgment normal.     Female chaperone  present for pelvic portion of the physical exam  Assessment: 39 y.o. [id number] for urologic evaluation of incontinence 1. Stress incontinence of urine 2. Cystocele, midline Options of PT, Pessary, and Surgery (AR, Sling) discussed in detail. Info given.  Based on sx and age, would rec AR with TVT Vaginal sling procedure.  Recovery and need for lifting and sexual restrictions discussed.    Will schedule time back to discuss again in detail and ensure comfortable w decision for surgery.  Surgery may help some or a a lot, and also may not last forever, as we know there is a high recurrence rate, but hopefully years if not decades from now.   Barnett Applebaum, MD, [facility], [facility] 02/15/2020  2:21 PM

## 2020-02-15 NOTE — Patient Instructions (Signed)
Surgery for Incontinence Urethral Vaginal Sling  A urethral vaginal sling procedure is surgery to correct urinary incontinence. Urinary incontinence is passing urine without one's control. It is common in older women, and in women who have had children. In this surgery, a strong piece of material is placed under the tube that drains the bladder (urethra). This sling is made of tension-free vaginal tape or nylon mesh. It fits under the urethra like a hammock. The sling is put in position to straighten, support, and hold the urethra in its normal position. Tell a health care provider about:  Any allergies you have.  All medicines you are taking, including vitamins, herbs, eye drops, creams, and over-the-counter medicines.  Any problems you or family members have had with anesthetic medicines.  Any blood disorders you have.  Any surgeries you have had.  Any medical conditions you have.  Whether you are pregnant or may be pregnant. What are the risks? Generally, this is a safe procedure. However, problems may occur, including:  Infection.  Excessive bleeding.  Allergic reactions to medicines.  Damage to other structures or organs.  Problems urinating for several days or weeks.  Return of the urinary incontinence.  Mesh failure. Be sure to talk with your health care provider about the options you have for sling material and the risks associated with each material. What happens before the procedure? Staying hydrated Follow instructions from your health care provider about hydration, which may include:  Up to 2 hours before the procedure - you may continue to drink clear liquids, such as water, clear fruit juice, black coffee, and plain tea. Eating and drinking restrictions Follow instructions from your health care provider about eating and drinking, which may include:  8 hours before the procedure - stop eating heavy meals or foods such as meat, fried foods, or fatty foods.  6  hours before the procedure - stop eating light meals or foods, such as toast or cereal.  6 hours before the procedure - stop drinking milk or drinks that contain milk.  2 hours before the procedure - stop drinking clear liquids. Medicines  Ask your health care provider about: ? Changing or stopping your regular medicines. This is especially important if you are taking diabetes medicines or blood thinners. ? Taking over-the-counter medicines, vitamins, herbs, and supplements. ? Taking medicines such as aspirin and ibuprofen. These medicines can thin your blood. Do not take these medicines unless your health care provider tells you to take them.  You may be given antibiotic medicine to help prevent infection. General instructions  Ask your health care provider how your surgical site will be marked or identified.  You may be asked to shower with a germ-killing soap.  Do not use any products that contain nicotine or tobacco, such as cigarettes and e-cigarettes, for 2 weeks before the surgery. If you need help quitting, ask your health care provider.  Plan to have someone take you home from the hospital or clinic. What happens during the procedure?  To reduce your risk of infection: ? Your health care team will wash or sanitize their hands. ? Hair may be removed from the surgical area. ? Your skin will be washed with soap.  An IV will be inserted into one of your veins.  You will be given one or both of the following: ? A medicine to make you fall asleep (general anesthetic). ? A medicine that is injected into your spine to numb the area below and slightly above the  injection site (spinal anesthetic).  A catheter will be placed in your bladder to drain urine during the procedure.  An incision will be made in your vagina and on the lower part of your abdomen.  The sling material will be passed around your bladder neck and stitched (sutured) to the muscles to hold the urethra in its  normal position.  The incisions will then be closed with sutures. The procedure may vary among health care providers and hospitals. What happens after the procedure?  Your blood pressure, heart rate, breathing rate, and blood oxygen level will be monitored until the medicines you were given have worn off.  You will have a catheter in place to drain your bladder. This will stay in place until your bladder is working properly on its own.  You may have a gauze packing in the vagina to prevent bleeding. This will be removed in 1-2 days. Summary  A urethral vaginal sling procedure is surgery to correct urinary incontinence.  In this surgery, a strong piece of material is placed under the urethra to hold it in its normal position.  Follow instructions from your health care provider about eating and drinking before the procedure.  After surgery, you will have a urinary catheter in place until your bladder works properly on its own again. This information is not intended to replace advice given to you by your health care provider. Make sure you discuss any questions you have with your health care provider. Document Revised: 09/06/2017 Document Reviewed: 01/02/2017 Elsevier Patient Education  2020 [employer].

## 2020-02-23 DIAGNOSIS — I83812 Varicose veins of left lower extremities with pain: Secondary | ICD-10-CM | POA: Diagnosis not present

## 2020-02-23 DIAGNOSIS — I8312 Varicose veins of left lower extremity with inflammation: Secondary | ICD-10-CM | POA: Diagnosis not present

## 2020-03-16 ENCOUNTER — Telehealth: Payer: Self-pay | Admitting: Obstetrics & Gynecology

## 2020-03-16 NOTE — Telephone Encounter (Signed)
-----   Message from Robert P Harris, MD sent at 02/15/2020  2:22 PM EDT ----- Regarding: Surgery Surgery Booking Request Patient Full Name:  Shelly Williams  MRN: [id number]  DOB: 09/03/1981  Surgeon: Robert Paul Harris, MD  Requested Surgery Date and Time: ANY Primary Diagnosis AND Code:   ICD-10-CM  1. Stress incontinence of urine  N39.3  2. Cystocele, midline  N81.11 Secondary Diagnosis and Code:  Surgical Procedure: Anterior Colporrhaphy with TVT Vaginal Sling Procedure L&D Notification: No Admission Status: same day surgery Length of Surgery: 45 min Special Case Needs: Yes - TVT EXACT H&P: Yes Phone Interview???:  Yes Interpreter: No Language:  Medical Clearance:  No Special Scheduling Instructions: none Any known health/anesthesia issues, diabetes, sleep apnea, latex allergy, defibrillator/pacemaker?: No Acuity: P3   (P1 highest, P2 delay may cause harm, P3 low, elective gyn, P4 lowest)   

## 2020-04-06 ENCOUNTER — Ambulatory Visit (INDEPENDENT_AMBULATORY_CARE_PROVIDER_SITE_OTHER): Payer: BC Managed Care – PPO | Admitting: Family Medicine

## 2020-04-06 ENCOUNTER — Other Ambulatory Visit: Payer: Self-pay

## 2020-04-06 ENCOUNTER — Encounter: Payer: Self-pay | Admitting: Family Medicine

## 2020-04-06 MED ORDER — PHENTERMINE HCL 15 MG PO CAPS: 15.0000 mg | ORAL_CAPSULE | ORAL | 0 refills | Status: DC

## 2020-04-06 MED ORDER — TOPIRAMATE 50 MG PO TABS: ORAL_TABLET | ORAL | 0 refills | Status: DC

## 2020-04-06 NOTE — Patient Instructions (Signed)
.   Please review the attached list of medications and notify my office if there are any errors.   . Please bring all of your medications to every appointment so we can make sure that our medication list is the same as yours.   

## 2020-04-06 NOTE — Progress Notes (Signed)
Established patient visit   Patient: Shelly Williams   DOB: November 20, 1980   39 y.o. Female  MRN: [id number] Visit Date: 04/06/2020  Today's healthcare provider: [name], MD   Chief Complaint  Patient presents with  . Weight Gain   I,Latasha Walston,acting as a scribe for [name], MD.,have documented all relevant documentation on the behalf of [name], MD,as directed by  [name], MD while in the presence of [name], MD.  Subjective    HPI Weight Gain:  Patient presents today to discuss weight gain. Patient states she was taking fluoxetine and buspirone and discontinued due to weight change in February, but has continued to gain weight. States she occasional feels a little down, but doesn't having anxiety and is sleeping better. Patient reports she tried exercising and diet with no change in weight. Patient is requesting a medication to help with weight loss. Walks or runs 4-5 days a week for the last 3 months. Is taking B12, b-complex and vitamin c.   No family history of heart disease. No smoking history. Rarely drinks alcohol.  Wt Readings from Last 3 Encounters:  04/06/20 214 lb 3.2 oz (97.2 kg)  02/15/20 210 lb (95.3 kg)  01/18/20 207 lb (93.9 kg)        Medications: No outpatient medications prior to visit.   No facility-administered medications prior to visit.    Review of Systems  Constitutional: Positive for unexpected weight change.  Respiratory: Negative.   Cardiovascular: Negative.   Musculoskeletal: Negative.       Objective    BP (!) 83/55 (BP Location: Right Arm, Patient Position: Sitting, Cuff Size: Large)   Pulse 73   Temp (!) 97.1 F (36.2 C) (Temporal)   Ht 5\' 2"  (1.575 m)   Wt 214 lb 3.2 oz (97.2 kg)   BMI 39.18 kg/m    Physical Exam   General: Appearance:    Obese female in no acute distress  Eyes:    PERRL, conjunctiva/corneas clear, EOM's intact       Lungs:     Clear to auscultation bilaterally,  respirations unlabored  Heart:    Normal heart rate. Normal rhythm. No murmurs, rubs, or gallops.   MS:   All extremities are intact.   Neurologic:   Awake, alert, oriented x 3. No apparent focal neurological           defect.       No results found for any visits on 04/06/20.  Assessment & Plan     1. Morbid obesity ([place]) She reports healthy lifestyle choices but is still unable to lose weight. She had thorough normal routine labs in April. Counseled on risks and benefits of various weight loss medications. Counseled regarding potential CV side effects of phentermine and topiramate. Counseled on risk of exacerbation of anxiety with phentermine and risk of exacerbation of depression with topiramate. She feels that she is not having any significant issues with anxiety or depression at this time. She wishes to initiate with combination treatment, but will notify me if she develops any adverse mood changes  - phentermine 15 MG capsule; Take 1 capsule (15 mg total) by mouth every morning.  Dispense: 30 capsule; Refill: 0 - topiramate (TOPAMAX) 50 MG tablet; 1/2 tablet every morning for 14 days, then increase to 1 tablet every morning  Dispense: 30 tablet; Refill: 0   Is to follow up with Dr. B in 1 month      The entirety of  the information documented in the History of Present Illness, Review of Systems and Physical Exam were personally obtained by me. Portions of this information were initially documented by the CMA and reviewed by me for thoroughness and accuracy.      [name], MD  [facility] [phone] (phone) [phone] (fax)  [place]

## 2020-04-08 ENCOUNTER — Other Ambulatory Visit: Payer: Self-pay | Admitting: Family Medicine

## 2020-04-08 NOTE — Telephone Encounter (Signed)
Pt called and is requesting to have her medications transferred to new pharmacy. Pt is requesting to have both phentermine and topamax transferred over because it is cheaper for her. Please advise.      [place], [address]  [place] [address]  Phone: [phone] Fax: [phone]  Hours: Not open 24 hours

## 2020-04-08 NOTE — Telephone Encounter (Signed)
Patient would like transferred to new pharmacy because it's cheaper Requested Prescriptions  Pending Prescriptions Disp Refills   phentermine 15 MG capsule 30 capsule 0    Sig: Take 1 capsule (15 mg total) by mouth every morning.      Not Delegated - Gastroenterology:  Antiobesity Agents Failed - 04/08/2020  1:47 PM      Failed - This refill cannot be delegated      Failed - Last BP in normal range    BP Readings from Last 1 Encounters:  04/06/20 (!) 83/55          Passed - Last Heart Rate in normal range    Pulse Readings from Last 1 Encounters:  04/06/20 73          Passed - Valid encounter within last 12 months    Recent Outpatient Visits           2 days ago Morbid obesity [facility])   [facility] Birdie Sons, MD   2 months ago Annual physical exam   [facility] [place], Dionne Bucy, MD   3 months ago GAD (generalized anxiety disorder)   [facility], Dionne Bucy, MD   7 months ago Chronic pain of left knee   [facility] [place], Dionne Bucy, MD       Future Appointments             In 1 month Fisher, Kirstie Peri, MD [facility], [place]   In 9 months Bacigalupo, Dionne Bucy, MD [facility]              topiramate (TOPAMAX) 50 MG tablet 30 tablet 0    Sig: 1/2 tablet every morning for 14 days, then increase to 1 tablet every morning      Not Delegated - Neurology: Anticonvulsants - topiramate & zonisamide Failed - 04/08/2020  1:47 PM      Failed - This refill cannot be delegated      Passed - Cr in normal range and within 360 days    Creatinine  Date Value Ref Range Status  12/08/2012 0.82 0.60 - 1.30 mg/dL Final   Creatinine, Ser  Date Value Ref Range Status  01/28/2020 0.57 0.57 - 1.00 mg/dL Final          Passed - CO2 in normal range and within 360 days    CO2  Date Value Ref Range Status  01/28/2020 20 20 - 29 mmol/L Final   Co2  Date Value Ref  Range Status  12/08/2012 26 21 - 32 mmol/L Final          Passed - Valid encounter within last 12 months    Recent Outpatient Visits           2 days ago Morbid obesity [facility])   [facility] Birdie Sons, MD   2 months ago Annual physical exam   [facility] [place], Dionne Bucy, MD   3 months ago GAD (generalized anxiety disorder)   [facility], Dionne Bucy, MD   7 months ago Chronic pain of left knee   [facility] [place], Dionne Bucy, MD       Future Appointments             In 1 month Fisher, Kirstie Peri, MD [facility], [place]   In 9 months Bacigalupo, Dionne Bucy, MD [facility], [place]

## 2020-04-09 MED ORDER — PHENTERMINE HCL 15 MG PO CAPS: 15.0000 mg | ORAL_CAPSULE | ORAL | 0 refills | Status: DC

## 2020-04-09 MED ORDER — TOPIRAMATE 50 MG PO TABS: ORAL_TABLET | ORAL | 0 refills | Status: DC

## 2020-05-04 NOTE — Progress Notes (Deleted)
     Established patient visit   Patient: Shelly Williams   DOB: April 13, 1981   39 y.o. Female  MRN: [id number] Visit Date: 05/06/2020  Today's healthcare provider: [name], MD   No chief complaint on file.   Subjective    HPI  Follow up for Morbid Obesity:  The patient was last seen for this 1 months ago. Changes made at last visit include starting Phentermine 15mg  daily and Topamax 50 MG tablet; 1/2 tablet every morning for 14 days, then increase to 1 tablet every morning .  She reports *** compliance with treatment. She feels that condition is {improved/worse/unchanged:[id number]}. She is not having side effects.   ----------------------------------------------------------------------------------------- Wt Readings from Last 3 Encounters:  04/06/20 214 lb 3.2 oz (97.2 kg)  02/15/20 210 lb (95.3 kg)  01/18/20 207 lb (93.9 kg)     {Show patient history (optional):23778::" "}   Medications: Outpatient Medications Prior to Visit  Medication Sig  . phentermine 15 MG capsule Take 1 capsule (15 mg total) by mouth every morning.  . topiramate (TOPAMAX) 50 MG tablet 1/2 tablet every morning for 14 days, then increase to 1 tablet every morning   No facility-administered medications prior to visit.     {Heme  Chem  Endocrine  Serology  Results Review (optional):23779::" "}  Objective    There were no vitals taken for this visit. {Show previous vital signs (optional):23777::" "}  Physical Exam   General: Appearance:    Obese female in no acute distress  Eyes:    PERRL, conjunctiva/corneas clear, EOM's intact       Lungs:     Clear to auscultation bilaterally, respirations unlabored  Heart:    Normal heart rate. Normal rhythm. No murmurs, rubs, or gallops.   MS:   All extremities are intact.   Neurologic:   Awake, alert, oriented x 3. No apparent focal neurological           defect.         No results found for any visits on 05/06/20.  Assessment & Plan      ***  No follow-ups on file.      {provider attestation***:1}   [name], MD  [facility] [phone] (phone) [phone] (fax)  [place]

## 2020-05-06 ENCOUNTER — Other Ambulatory Visit: Payer: Self-pay | Admitting: Family Medicine

## 2020-05-06 ENCOUNTER — Ambulatory Visit: Payer: Self-pay | Admitting: Family Medicine

## 2020-05-06 NOTE — Telephone Encounter (Signed)
Requested medication (s) are due for refill today: no  Requested medication (s) are on the active medication list: yes  Last refill:  04/08/2020  Future visit scheduled:yes  Notes to clinic:  Patient would like enough medication to last until her appointment on 05/16/2020   Requested Prescriptions  Pending Prescriptions Disp Refills   topiramate (TOPAMAX) 50 MG tablet 30 tablet 0    Sig: 1/2 tablet every morning for 14 days, then increase to 1 tablet every morning      Not Delegated - Neurology: Anticonvulsants - topiramate & zonisamide Failed - 05/06/2020 10:50 AM      Failed - This refill cannot be delegated      Passed - Cr in normal range and within 360 days    Creatinine  Date Value Ref Range Status  12/08/2012 0.82 0.60 - 1.30 mg/dL Final   Creatinine, Ser  Date Value Ref Range Status  01/28/2020 0.57 0.57 - 1.00 mg/dL Final          Passed - CO2 in normal range and within 360 days    CO2  Date Value Ref Range Status  01/28/2020 20 20 - 29 mmol/L Final   Co2  Date Value Ref Range Status  12/08/2012 26 21 - 32 mmol/L Final          Passed - Valid encounter within last 12 months    Recent Outpatient Visits           1 month ago Morbid obesity ([place])   [facility] Birdie Sons, MD   3 months ago Annual physical exam   [facility] [place], Dionne Bucy, MD   4 months ago GAD (generalized anxiety disorder)   [facility], Dionne Bucy, MD   8 months ago Chronic pain of left knee   [facility], Dionne Bucy, MD       Future Appointments             In 1 week Caryn Section, Kirstie Peri, MD [facility]   In 8 months Bacigalupo, Dionne Bucy, MD [facility], [facility]              phentermine 15 MG capsule 30 capsule 0    Sig: Take 1 capsule (15 mg total) by mouth every morning.      Not Delegated - Gastroenterology:  Antiobesity Agents Failed - 05/06/2020 10:50 AM       Failed - This refill cannot be delegated      Failed - Last BP in normal range    BP Readings from Last 1 Encounters:  04/06/20 (!) 83/55          Passed - Last Heart Rate in normal range    Pulse Readings from Last 1 Encounters:  04/06/20 73          Passed - Valid encounter within last 12 months    Recent Outpatient Visits           1 month ago Morbid obesity [facility])   [facility] Birdie Sons, MD   3 months ago Annual physical exam   [facility] [place], Dionne Bucy, MD   4 months ago GAD (generalized anxiety disorder)   [facility], Dionne Bucy, MD   8 months ago Chronic pain of left knee   [facility], Dionne Bucy, MD       Future Appointments  In 1 week Fisher, Kirstie Peri, MD [facility], [place]   In 8 months Bacigalupo, Dionne Bucy, MD [facility], [facility]

## 2020-05-06 NOTE — Telephone Encounter (Signed)
Patient requesting phentermine 15 MG capsule and topiramate (TOPAMAX) 50 MG tablet, informed patient please allow 48 to 72 hour turn around. Patient states she's traveling and would like  request expedited. Patient was scheduled today with PCP but will arrive late, patient would like refill to last until  8/9 appt.    [place], [place] Phone:  [phone]  Fax:  [phone]

## 2020-05-08 ENCOUNTER — Emergency Department (HOSPITAL_COMMUNITY)
Admission: EM | Admit: 2020-05-08 | Discharge: 2020-05-08 | Disposition: A | Discharge status: Home / Self Care | Payer: Medicaid Other | Attending: Emergency Medicine | Admitting: Emergency Medicine

## 2020-05-08 ENCOUNTER — Encounter (HOSPITAL_COMMUNITY): Payer: Self-pay

## 2020-05-08 ENCOUNTER — Other Ambulatory Visit: Payer: Self-pay

## 2020-05-08 DIAGNOSIS — Z79899 Other long term (current) drug therapy: Secondary | ICD-10-CM | POA: Diagnosis not present

## 2020-05-08 DIAGNOSIS — M79675 Pain in left toe(s): Secondary | ICD-10-CM | POA: Diagnosis present

## 2020-05-08 DIAGNOSIS — R29818 Other symptoms and signs involving the nervous system: Secondary | ICD-10-CM | POA: Insufficient documentation

## 2020-05-08 DIAGNOSIS — L600 Ingrowing nail: Secondary | ICD-10-CM

## 2020-05-08 DIAGNOSIS — M2550 Pain in unspecified joint: Secondary | ICD-10-CM | POA: Diagnosis not present

## 2020-05-08 MED ORDER — TRAMADOL HCL 50 MG PO TABS: 50.0000 mg | ORAL_TABLET | Freq: Four times a day (QID) | ORAL | 0 refills | Status: DC | PRN

## 2020-05-08 MED ORDER — CEPHALEXIN 500 MG PO CAPS: 500.0000 mg | ORAL_CAPSULE | Freq: Four times a day (QID) | ORAL | 0 refills | Status: DC

## 2020-05-08 NOTE — Discharge Instructions (Signed)
Your toenail is not clearly "ingrown" but you do have an infection of the cuticle edge.  Continue your warm soaks using epsom salt water twice daily for 10 minutes each. Complete the entire course of the antibiotics.  You may use the tramadol for the next 2 days, after this, ibuprofen should be sufficient for pain relief.  Do not drive within 4 hours of taking tramadol as it can make you sleepy.

## 2020-05-08 NOTE — ED Triage Notes (Signed)
Pt reports pain from ingrown toenail since Thursday.  Has been taking ibuprofen without relief.

## 2020-05-08 NOTE — ED Provider Notes (Signed)
[facility] EMERGENCY DEPARTMENT Provider Note   CSN: [id number] Arrival date & time: 05/08/20  1245     History Chief Complaint  Patient presents with  . Toe Pain    Shelly Williams is a 39 y.o. female with no significant past medical history presenting with a 3-day history of left great toe pain.  She noticed swelling along the side of her toenail which has become more tender, erythematous and is now draining a yellow-tinged discharge.  She felt she had an ingrown toenail and tried to remove it but was unsuccessful.  There is no radiation of pain, it is localized to the great toe and worsened with movement and walking.  She has taken Advil with no improvement in her symptoms, has also used warm soaks also without improvement.  HPI     Past Medical History:  Diagnosis Date  . Abdominal pain 2014  . Depression   . Gallstones 2014    Patient Active Problem List   Diagnosis Date Noted  . Cystocele, midline 02/15/2020  . Stress incontinence of urine 01/19/2020  . Class 2 obesity without serious comorbidity with body mass index (BMI) of 37.0 to 37.9 in adult 08/31/2019  . Nausea 08/31/2019  . Multiple falls 08/31/2019  . Transient neurological symptoms 08/31/2019  . Chronic pain of left knee 08/31/2019    Past Surgical History:  Procedure Laterality Date  . CHOLECYSTECTOMY    . TUBAL LIGATION       OB History    Gravida  4   Para  3   Term      Preterm      AB  1   Living  3     SAB  1   TAB      Ectopic      Multiple      Live Births           Obstetric Comments  1st Menstrual Cycle: 25 1st Pregnancy: 20        Family History  Problem Relation Age of Onset  . Cancer Maternal Aunt        lung  . Cancer Other        lung - maternal great aunt  . Cancer Maternal Aunt        breast  . Healthy Mother   . Healthy Father   . Healthy Sister   . Healthy Brother   . Healthy Daughter   . Healthy Son   . Healthy Daughter   . Healthy Daughter      Social History   Tobacco Use  . Smoking status: Never Smoker  . Smokeless tobacco: Never Used  Vaping Use  . Vaping Use: Never used  Substance Use Topics  . Alcohol use: No  . Drug use: No    Home Medications Prior to Admission medications   Medication Sig Start Date End Date Taking? Authorizing Provider  cephALEXin (KEFLEX) 500 MG capsule Take 1 capsule (500 mg total) by mouth 4 (four) times daily. 05/08/20   Evalee Jefferson, PA-C  phentermine 15 MG capsule Take 1 capsule (15 mg total) by mouth every morning. 04/09/20   Birdie Sons, MD  topiramate (TOPAMAX) 50 MG tablet 1/2 tablet every morning for 14 days, then increase to 1 tablet every morning 04/09/20   Birdie Sons, MD  traMADol (ULTRAM) 50 MG tablet Take 1 tablet (50 mg total) by mouth every 6 (six) hours as needed. 05/08/20   Evalee Jefferson, PA-C  Allergies    Motrin [ibuprofen]  Review of Systems   Review of Systems  Constitutional: Negative for fever.  Musculoskeletal: Positive for arthralgias. Negative for joint swelling and myalgias.  Skin: Positive for color change.  Neurological: Negative for weakness and numbness.    Physical Exam Updated Vital Signs BP 107/69 (BP Location: Left Arm)   Pulse 71   Temp 98.2 F (36.8 C) (Oral)   Resp 16   Ht 5\' 2"  (1.575 m)   Wt 88.5 kg   LMP 05/01/2020   SpO2 99%   BMI 35.67 kg/m   Physical Exam Constitutional:      Appearance: She is well-developed.  HENT:     Head: Atraumatic.  Cardiovascular:     Comments: Pulses equal bilaterally Musculoskeletal:        General: Tenderness present.     Cervical back: Normal range of motion.     Left foot: Normal capillary refill. Normal pulse.     Comments: Erythema, edema and tenderness to the left great toe ulnar side cuticle with a serous drainage present.  There is no palpable or visible pus pocket.  The distal toe is symptom-free, suggesting an actual ingrown nail is less likely, favoring simple cuticle infection.    Skin:    General: Skin is warm and dry.  Neurological:     Mental Status: She is alert.     Sensory: No sensory deficit.     Deep Tendon Reflexes: Reflexes normal.     ED Results / Procedures / Treatments   Labs (all labs ordered are listed, but only abnormal results are displayed) Labs Reviewed - No data to display  EKG None  Radiology No results found.  Procedures Procedures (including critical care time)  Medications Ordered in ED Medications - No data to display  ED Course  I have reviewed the triage vital signs and the nursing notes.  Pertinent labs & imaging results that were available during my care of the patient were reviewed by me and considered in my medical decision making (see chart for details).    MDM Rules/Calculators/A&P                          Patient was encouraged to continue warm soaks, preferably with Epsom salt water.  She was started on Keflex.  She asked for stronger pain medicine than ibuprofen, I gave her 2-day treatment of tramadol after which she can return to ibuprofen.  She was also given a referral to podiatry if her symptoms persist or continue to return once this treatment is completed.  As needed follow-up anticipated. Final Clinical Impression(s) / ED Diagnoses Final diagnoses:  Ingrown toenail with infection    Rx / DC Orders ED Discharge Orders         Ordered    cephALEXin (KEFLEX) 500 MG capsule  4 times daily     Discontinue  Reprint     05/08/20 0922    traMADol (ULTRAM) 50 MG tablet  Every 6 hours PRN     Discontinue  Reprint     05/08/20 0922           Evalee Jefferson, PA-C 05/08/20 1021    Lajean Saver, MD 05/08/20 1742

## 2020-05-09 ENCOUNTER — Ambulatory Visit: Payer: BC Managed Care – PPO | Admitting: Family Medicine

## 2020-05-09 MED ORDER — TOPIRAMATE 50 MG PO TABS: 50.0000 mg | ORAL_TABLET | Freq: Every day | ORAL | 0 refills | Status: DC

## 2020-05-09 MED ORDER — PHENTERMINE HCL 15 MG PO CAPS: 15.0000 mg | ORAL_CAPSULE | ORAL | 0 refills | Status: DC

## 2020-05-16 ENCOUNTER — Other Ambulatory Visit: Payer: Self-pay

## 2020-05-16 ENCOUNTER — Ambulatory Visit (INDEPENDENT_AMBULATORY_CARE_PROVIDER_SITE_OTHER): Payer: BC Managed Care – PPO | Admitting: Family Medicine

## 2020-05-16 ENCOUNTER — Encounter: Payer: Self-pay | Admitting: Family Medicine

## 2020-05-16 VITALS — BP 100/70 | HR 62 | Temp 98.9°F | Resp 16 | Ht 62.0 in | Wt 202.0 lb

## 2020-05-16 DIAGNOSIS — L600 Ingrowing nail: Secondary | ICD-10-CM

## 2020-05-16 MED ORDER — PHENTERMINE HCL 15 MG PO CAPS: 15.0000 mg | ORAL_CAPSULE | ORAL | 3 refills | Status: DC

## 2020-05-16 MED ORDER — TOPIRAMATE 50 MG PO TABS: 50.0000 mg | ORAL_TABLET | Freq: Every day | ORAL | 3 refills | Status: DC

## 2020-05-16 MED ORDER — CEPHALEXIN 500 MG PO CAPS: 500.0000 mg | ORAL_CAPSULE | Freq: Four times a day (QID) | ORAL | 0 refills | Status: AC

## 2020-05-16 NOTE — Patient Instructions (Signed)
.   Call for podiatry referral if your toe is not completely better when finished with the second round of antibiotic.

## 2020-05-16 NOTE — Progress Notes (Signed)
I,Akio Hudnall L Tyffany Waldrop,acting as a scribe for [name], MD.,have documented all relevant documentation on the behalf of [name], MD,as directed by  [name], MD while in the presence of [name], MD.  Established patient visit   Patient: Shelly Williams   DOB: [birth year]   39 y.o. Female  MRN: [id number] Visit Date: 05/16/2020  Today's healthcare provider: [name], MD   No chief complaint on file.  Subjective    HPI  Follow up for Morbid obesity:  The patient was last seen for this problem on 04/06/2020. Changes made at last visit include starting Phentermine 15mg  daily and Topamax 50mg ; tablet 1/2 tablet every morning for 14 days, then increase to 1 tablet every morning.  She reports good compliance with treatment. Patient has been out of medication for about 10 days. She feels that condition is Improved.  She state home weight measurement was 217# prior to starting to medication. It dropped to 196 before she ran out, and is now up to 202. She has also increased her exercise regiment up to 2 hours 5 days a week.  She is not having side effects.   ----------------------------------------------------------------------------------------- She is also here to follow up on infected ingrown toe-nail was seen at ER 05-08-2020 and prescription for cephalexin 10 days and tramadol. She states tramadol didn't help pain at all, but toe is much better. She has been soaking af few times day. However it is still a little swollen and tender to the touch.     Medications: Outpatient Medications Prior to Visit  Medication Sig   cephALEXin (KEFLEX) 500 MG capsule Take 1 capsule (500 mg total) by mouth 4 (four) times daily.   phentermine 15 MG capsule Take 1 capsule (15 mg total) by mouth every morning.   topiramate (TOPAMAX) 50 MG tablet Take 1 tablet (50 mg total) by mouth daily.   traMADol (ULTRAM) 50 MG tablet Take 1 tablet (50 mg total) by mouth every 6 (six) hours as  needed.   No facility-administered medications prior to visit.    Review of Systems  Constitutional: Negative for appetite change, chills, fatigue and fever.  Respiratory: Negative for chest tightness and shortness of breath.   Cardiovascular: Negative for chest pain and palpitations.  Gastrointestinal: Negative for abdominal pain, nausea and vomiting.  Neurological: Negative for dizziness and weakness.      Objective    BP 100/70 (BP Location: Left Arm, Patient Position: Sitting, Cuff Size: Large)    Pulse 62    Temp 98.9 F (37.2 C) (Oral)    Resp 16    Ht 5\' 2"  (1.575 m)    Wt 202 lb (91.6 kg)    LMP 05/01/2020    SpO2 98% Comment: room air   BMI 36.95 kg/m    Physical Exam  Lateral aspect of left great toe slightly inflamed, tender to touch. No discharge.   No results found for any visits on 05/16/20.  Assessment & Plan     1. Morbid obesity ([place]) She had lost nearly 20 pounds with low dose phentermine and topiramate combination. Has now been out of medication for 10 days. She is very happy with medication regiment and wishes to continue.  - phentermine 15 MG capsule; Take 1 capsule (15 mg total) by mouth every morning.  Dispense: 30 capsule; Refill: 3 - topiramate (TOPAMAX) 50 MG tablet; Take 1 tablet (50 mg total) by mouth daily.  Dispense: 30 tablet; Refill: 3  She is to follow up  Dr. B 3-4 months. Continue current exercise regiment.   2. Ingrown toenail Improved since starting cephalexin 9 days ago, but still a bit inflamed. She is to continue regular soaking, extend antibiotic additional 7 days. Call for podiatry referral if not completely better when antibiotic finished.      Future Appointments  Date Time Provider [place]  08/17/2020 11:00 AM Virginia Crews, MD [facility]  01/18/2021  2:00 PM Bacigalupo, Dionne Bucy, MD [facility]        The entirety of the information documented in the History of Present Illness, Review of Systems and Physical  Exam were personally obtained by me. Portions of this information were initially documented by the CMA and reviewed by me for thoroughness and accuracy.      [name], MD  [facility] [phone] (phone) [phone] (fax)  [place]

## 2020-05-19 ENCOUNTER — Telehealth: Payer: Self-pay | Admitting: Obstetrics & Gynecology

## 2020-05-19 NOTE — Telephone Encounter (Signed)
-----   Message from Gae Dry, MD sent at 02/15/2020  2:22 PM EDT ----- Regarding: Surgery Surgery Booking Request Patient Full Name:  Shelly Williams  MRN: [id number]  DOB: 1981-01-02  Surgeon: Hoyt Koch, MD  Requested Surgery Date and Time: ANY Primary Diagnosis AND Code:   ICD-10-CM  1. Stress incontinence of urine  N39.3  2. Cystocele, midline  N81.11 Secondary Diagnosis and Code:  Surgical Procedure: Anterior Colporrhaphy with TVT Vaginal Sling Procedure L&D Notification: No Admission Status: same day surgery Length of Surgery: 45 min Special Case Needs: Yes - TVT EXACT H&P: Yes Phone Interview???:  Yes Interpreter: No Language:  Medical Clearance:  No Special Scheduling Instructions: none Any known health/anesthesia issues, diabetes, sleep apnea, latex allergy, defibrillator/pacemaker?: No Acuity: P3   (P1 highest, P2 delay may cause harm, P3 low, elective gyn, P4 lowest)

## 2020-06-07 ENCOUNTER — Telehealth: Payer: Self-pay | Admitting: Obstetrics & Gynecology

## 2020-06-07 NOTE — Telephone Encounter (Signed)
After months of trying to reach this pt I did when I called today to try to schedule for anterior colporrhaphy w TVT Vaginal Sling with [facility]  DOS 9/21  H&P 9/14 @ 11:20   Covid testing 8/17 @ 8-10:30, [facility] [employer], drive up and wear mask. Advised pt to quarantine until DOS.  Pre-admit phone call appointment to be requested - date and time will be included on H&P paper work. Also all appointments will be updated on pt MyChart. Explained that this appointment has a call window. Based on the time scheduled will indicate if the call will be received within a 4 hour window before 1:00 or after.  Advised that pt may also receive calls from the hospital pharmacy and pre-service center.  Confirmed pt has [facility] Medicaid as primary insurance. No secondary insurance.  Pt asked how long her recovery time will be as she had forgotten. I adv that all of that info will be addressed at her pre-op on 9/14.

## 2020-06-07 NOTE — Telephone Encounter (Signed)
-----   Message from Gae Dry, MD sent at 02/15/2020  2:22 PM EDT ----- Regarding: Surgery Surgery Booking Request Patient Full Name:  Latroya Ng  MRN: [id number]  DOB: 11-Sep-1981  Surgeon: Hoyt Koch, MD  Requested Surgery Date and Time: ANY Primary Diagnosis AND Code:   ICD-10-CM  1. Stress incontinence of urine  N39.3  2. Cystocele, midline  N81.11 Secondary Diagnosis and Code:  Surgical Procedure: Anterior Colporrhaphy with TVT Vaginal Sling Procedure L&D Notification: No Admission Status: same day surgery Length of Surgery: 45 min Special Case Needs: Yes - TVT EXACT H&P: Yes Phone Interview???:  Yes Interpreter: No Language:  Medical Clearance:  No Special Scheduling Instructions: none Any known health/anesthesia issues, diabetes, sleep apnea, latex allergy, defibrillator/pacemaker?: No Acuity: P3   (P1 highest, P2 delay may cause harm, P3 low, elective gyn, P4 lowest)

## 2020-06-10 ENCOUNTER — Telehealth: Payer: Self-pay

## 2020-06-10 ENCOUNTER — Ambulatory Visit (INDEPENDENT_AMBULATORY_CARE_PROVIDER_SITE_OTHER): Payer: Medicaid Other | Admitting: Physician Assistant

## 2020-06-10 ENCOUNTER — Other Ambulatory Visit: Payer: Self-pay

## 2020-06-10 ENCOUNTER — Encounter: Payer: Self-pay | Admitting: Physician Assistant

## 2020-06-10 VITALS — BP 107/70 | HR 71 | Temp 97.7°F | Resp 16 | Wt 193.0 lb

## 2020-06-10 DIAGNOSIS — L237 Allergic contact dermatitis due to plants, except food: Secondary | ICD-10-CM | POA: Diagnosis not present

## 2020-06-10 MED ORDER — TRIAMCINOLONE ACETONIDE 0.1 % EX CREA: 1.0000 "application " | TOPICAL_CREAM | Freq: Two times a day (BID) | CUTANEOUS | 0 refills | Status: DC

## 2020-06-10 MED ORDER — PREDNISONE 10 MG (21) PO TBPK: ORAL_TABLET | ORAL | 0 refills | Status: DC

## 2020-06-10 NOTE — Telephone Encounter (Signed)
Copied from [place] [id number]. Topic: Clinical - COVID Pre-Screen >> Jun 10, 2020  8:07 AM Leward Quan A wrote: 1. To the best of your knowledge, have you been in close contact with anyone with a confirmed diagnosis of COVID 19?  no  If no - Proceed to next question; If yes - Schedule patient for a virtual visit  2. Have you had any one or more of the following: fever, chills, cough, shortness of breath or any flu-like symptoms?  no  If no - Proceed to next question; If yes - Schedule patient for a virtual visit  3. Have you been diagnosed with or have a previous diagnosis of COVID 19?  no  If no - Proceed to next question; If yes - Schedule patient for a virtual visit  4. I am going to go over a few other symptoms with you. Please let me know if you are experiencing any of the following: no  Ear, nose or throat discomfort  A sore throat  Headache  Muscle pain  Diarrhea  Loss of taste or smell  If no - Continue with scheduling process; If yes - Document in scheduling notes   Thank you for answering these questions. Please know we will ask you these questions or similar questions when you arrive for your appointment and again it's how we are keeping everyone safe. Also, to keep you safe, please use the provided hand sanitizer when you enter the building. Toni Arthurs, we are asking everyone in the building to wear a mask because they help [place] prevent the spread of germs.   Do you have a mask of your own, if not, we are happy to provide one for you. The last thing I want to go over with you is the no visitor guidelines. This means no one can attend the appointment with you unless you need physical assistance. I understand this may be different from your past appointments and I know this may be difficult but please know if someone is driving you we are happy to call them for you once your appointment is over.

## 2020-06-10 NOTE — Patient Instructions (Signed)
Poison Ivy Dermatitis Poison ivy dermatitis is inflammation of the skin that is caused by chemicals in the leaves of the poison ivy plant. The skin reaction often involves redness, swelling, blisters, and extreme itching. What are the causes? This condition is caused by a chemical (urushiol) found in the sap of the poison ivy plant. This chemical is sticky and can be easily spread to people, animals, and objects. You can get poison ivy dermatitis by:  Having direct contact with a poison ivy plant.  Touching animals, other people, or objects that have come in contact with poison ivy and have the chemical on them. What increases the risk? This condition is more likely to develop in people who:  Are outdoors often in wooded or [place] areas.  Go outdoors without wearing protective clothing, such as closed shoes, long pants, and a long-sleeved shirt. What are the signs or symptoms? Symptoms of this condition include:  Redness of the skin.  Extreme itching.  A rash that often includes bumps and blisters. The rash usually appears 48 hours after exposure, if you have been exposed before. If this is the first time you have been exposed, the rash may not appear until a week after exposure.  Swelling. This may occur if the reaction is more severe. Symptoms usually last for 1-2 weeks. However, the first time you develop this condition, symptoms may last 3-4 weeks. How is this diagnosed? This condition may be diagnosed based on your symptoms and a physical exam. Your health care provider may also ask you about any recent outdoor activity. How is this treated? Treatment for this condition will vary depending on how severe it is. Treatment may include:  Hydrocortisone cream or calamine lotion to relieve itching.  Oatmeal baths to soothe the skin.  Medicines, such as over-the-counter antihistamine tablets.  Oral steroid medicine, for more severe reactions. Follow these instructions at  home: Medicines  Take or apply over-the-counter and prescription medicines only as told by your health care provider.  Use hydrocortisone cream or calamine lotion as needed to soothe the skin and relieve itching. General instructions  Do not scratch or rub your skin.  Apply a cold, wet cloth (cold compress) to the affected areas or take baths in cool water. This will help with itching. Avoid hot baths and showers.  Take oatmeal baths as needed. Use colloidal oatmeal. You can get this at your local pharmacy or grocery store. Follow the instructions on the packaging.  While you have the rash, wash clothes right after you wear them.  Keep all follow-up visits as told by your health care provider. This is important. How is this prevented?   Learn to identify the poison ivy plant and avoid contact with the plant. This plant can be recognized by the number of leaves. Generally, poison ivy has three leaves with flowering branches on a single stem. The leaves are typically glossy, and they have jagged edges that come to a point at the front.  If you have been exposed to poison ivy, thoroughly wash with soap and water right away. You have about 30 minutes to remove the plant resin before it will cause the rash. Be sure to wash under your fingernails, because any plant resin there will continue to spread the rash.  When hiking or camping, wear clothes that will help you to avoid exposure on the skin. This includes long pants, a long-sleeved shirt, tall socks, and hiking boots. You can also apply preventive lotion to your skin to  help limit exposure.  If you suspect that your clothes or outdoor gear came in contact with poison ivy, rinse them off outside with a garden hose before you bring them inside your house.  When doing yard work or gardening, wear gloves, long sleeves, long pants, and boots. Wash your garden tools and gloves if they come in contact with poison ivy.  If you suspect that your  pet has come into contact with poison ivy, wash him or her with pet shampoo and water. Make sure to wear gloves while washing your pet. Contact a health care provider if you have:  Open sores in the rash area.  More redness, swelling, or pain in the affected area.  Redness that spreads beyond the rash area.  Fluid, blood, or pus coming from the affected area.  A fever.  A rash over a large area of your body.  A rash on your eyes, mouth, or genitals.  A rash that does not improve after a few weeks. Get help right away if:  Your face swells or your eyes swell shut.  You have trouble breathing.  You have trouble swallowing. These symptoms may represent a serious problem that is an emergency. Do not wait to see if the symptoms will go away. Get medical help right away. Call your local emergency services (911 in the [place] Do not drive yourself to the hospital. Summary  Poison ivy dermatitis is inflammation of the skin that is caused by chemicals in the leaves of the poison ivy plant.  Symptoms of this condition include redness, itching, a rash, and swelling.  Do not scratch or rub your skin.  Take or apply over-the-counter and prescription medicines only as told by your health care provider. This information is not intended to replace advice given to you by your health care provider. Make sure you discuss any questions you have with your health care provider. Document Revised: 01/16/2019 Document Reviewed: 09/19/2018 Elsevier Patient Education  2020 [employer].

## 2020-06-10 NOTE — Progress Notes (Signed)
Established patient visit   Patient: Shelly Williams   DOB: July 31, 1981   39 y.o. Female  MRN: [id number] Visit Date: 06/10/2020  Today's healthcare provider: Mar Daring, PA-C   Chief Complaint  Patient presents with  . Rash   Subjective    Rash This is a new problem. The current episode started in the past 7 days (Started since Saturday). The problem has been gradually worsening since onset. The affected locations include the right lower leg and right upper leg (Calf area). The rash is characterized by redness, itchiness and burning. She was exposed to an insect bite/sting. Pertinent negatives include no cough, facial edema, fatigue, joint pain or shortness of breath. Past treatments include topical steroids (Cortisone and Zyrtec, Benadryl ointment). The treatment provided no relief.   Worked in yard on Friday, rash started Saturday.   Patient Active Problem List   Diagnosis Date Noted  . Cystocele, midline 02/15/2020  . Stress incontinence of urine 01/19/2020  . Class 2 obesity without serious comorbidity with body mass index (BMI) of 37.0 to 37.9 in adult 08/31/2019  . Nausea 08/31/2019  . Multiple falls 08/31/2019  . Transient neurological symptoms 08/31/2019  . Chronic pain of left knee 08/31/2019   Past Medical History:  Diagnosis Date  . Abdominal pain 2014  . Depression   . Gallstones 2014       Medications: Outpatient Medications Prior to Visit  Medication Sig  . phentermine 15 MG capsule Take 1 capsule (15 mg total) by mouth every morning.  . topiramate (TOPAMAX) 50 MG tablet Take 1 tablet (50 mg total) by mouth daily.  . traMADol (ULTRAM) 50 MG tablet Take 1 tablet (50 mg total) by mouth every 6 (six) hours as needed.   No facility-administered medications prior to visit.    Review of Systems  Constitutional: Negative for fatigue.  Respiratory: Negative for cough and shortness of breath.   Cardiovascular: Negative.   Gastrointestinal:  Negative.   Musculoskeletal: Negative for joint pain.  Skin: Positive for rash.  Neurological: Negative.     Last CBC Lab Results  Component Value Date   WBC 6.3 01/28/2020   HGB 12.1 01/28/2020   HCT 37.4 01/28/2020   MCV 85 01/28/2020   MCH 27.5 01/28/2020   RDW 13.2 01/28/2020   PLT 151 97/67/3419   Last metabolic panel Lab Results  Component Value Date   GLUCOSE 88 01/28/2020   NA 140 01/28/2020   K 3.9 01/28/2020   CL 107 (H) 01/28/2020   CO2 20 01/28/2020   BUN 10 01/28/2020   CREATININE 0.57 01/28/2020   GFRNONAA 118 01/28/2020   GFRAA 136 01/28/2020   CALCIUM 8.8 01/28/2020   PROT 6.3 01/28/2020   ALBUMIN 3.9 01/28/2020   LABGLOB 2.4 01/28/2020   AGRATIO 1.6 01/28/2020   BILITOT 0.7 01/28/2020   ALKPHOS 77 01/28/2020   AST 18 01/28/2020   ALT 19 01/28/2020   ANIONGAP 7 12/08/2012      Objective    BP 107/70 (BP Location: Left Arm, Patient Position: Sitting, Cuff Size: Large)   Pulse 71   Temp 97.7 F (36.5 C) (Oral)   Resp 16   Wt 193 lb (87.5 kg)   BMI 35.30 kg/m  BP Readings from Last 3 Encounters:  06/21/20 90/60  06/10/20 107/70  05/16/20 100/70   Wt Readings from Last 3 Encounters:  06/21/20 196 lb (88.9 kg)  06/10/20 193 lb (87.5 kg)  05/16/20 202 lb (91.6 kg)  Physical Exam Vitals reviewed.  Constitutional:      Appearance: She is well-developed.  HENT:     Head: Normocephalic and atraumatic.  Pulmonary:     Effort: Pulmonary effort is normal. No respiratory distress.  Musculoskeletal:     Cervical back: Normal range of motion and neck supple.  Skin:    Findings: Rash (diffuse on extremities) present. Rash is vesicular.  Psychiatric:        Mood and Affect: Mood normal.        Behavior: Behavior normal.        Thought Content: Thought content normal.        Judgment: Judgment normal.       No results found for any visits on 06/10/20.  Assessment & Plan     1. Allergic contact dermatitis due to plants, except  food Rash c/w plant dermatitis. Will send in prednisone taper as below. Triamcinolone cream sent for prn itching. Call if worsening.  - predniSONE (STERAPRED UNI-PAK 21 TAB) 10 MG (21) TBPK tablet; 6 day taper; take as directed on package instructions  Dispense: 21 tablet; Refill: 0 - triamcinolone cream (KENALOG) 0.1 %; Apply 1 application topically 2 (two) times daily. (Patient not taking: Reported on 06/21/2020)  Dispense: 30 g; Refill: 0   No follow-ups on file.      Reynolds Bowl, PA-C, have reviewed all documentation for this visit. The documentation on 06/21/20 for the exam, diagnosis, procedures, and orders are all accurate and complete.   Rubye Beach  [facility] [phone] (phone) [phone] (fax)  [place]

## 2020-06-15 ENCOUNTER — Telehealth: Payer: Self-pay | Admitting: Obstetrics & Gynecology

## 2020-06-15 NOTE — Telephone Encounter (Signed)
Patient is calling to ask about how long does she need to wait until after her upcoming surgery would she be able to have another surgery. Patient is scheduled for December. Patient didn't not leave information about scheduled surgery for December. Patient is also asking what surgery she is scheduled for with Dr. Kenton Kingfisher in September she could remember the name of the surgery. Patient is also asking when does she need to stop taking her weight loss medication prior to surgery. Patient aware [place] out of the office today and will return tomorrow. Please advise patient does need interpreter for return call.

## 2020-06-16 NOTE — Telephone Encounter (Signed)
If able to help, please call and let her know these questions will all be answered at her preop appt 06/21/20.  Her surgery is a sling bladder tack procedure scheduled for 06/28/20, with planned 6 weeks recovery with main restrictions of no heavy lifting and no sex during that time.  Any medicine would need to be held the day before surgery.

## 2020-06-16 NOTE — Telephone Encounter (Signed)
Called pt, no answer, could not leave voice msg due to mail box full.

## 2020-06-17 NOTE — Telephone Encounter (Signed)
Tried again, no answer, voice mail box full.

## 2020-06-21 ENCOUNTER — Telehealth: Payer: Self-pay | Admitting: Family Medicine

## 2020-06-21 ENCOUNTER — Other Ambulatory Visit: Payer: Self-pay

## 2020-06-21 ENCOUNTER — Ambulatory Visit (INDEPENDENT_AMBULATORY_CARE_PROVIDER_SITE_OTHER): Payer: Medicaid Other | Admitting: Obstetrics & Gynecology

## 2020-06-21 ENCOUNTER — Encounter: Payer: Self-pay | Admitting: Obstetrics & Gynecology

## 2020-06-21 VITALS — BP 90/60 | Ht 62.0 in | Wt 196.0 lb

## 2020-06-21 DIAGNOSIS — N393 Stress incontinence (female) (male): Secondary | ICD-10-CM

## 2020-06-21 DIAGNOSIS — N8111 Cystocele, midline: Secondary | ICD-10-CM

## 2020-06-21 NOTE — Progress Notes (Signed)
PRE-OPERATIVE HISTORY AND PHYSICAL EXAM  HPI:  Shelly Williams is a 39 y.o. [phone] Patient's last menstrual period was 06/09/2020.; she is being admitted for surgery related to urinary incontinence.  Patient is not menopausal. She complains of urinary symptoms of urinary incontinence. Patient reports her symptoms began one year ago and have been worsening; and its severity is described as severe.  Her symptoms include incontinence and nocturnal enuresis.  She is having leakage of urine with stressful activities, such as coughing, sneezing, laughter, or exercise.  She is not having leakage of urine based on symptoms of urgency or bladder spasm/discomfort.  Denies Freq and Urgency. She does have Odor, embarrassing. She is not having nocturia, with only 1 episodes per night.  She is using pads or diapers daily for control of symptoms. She is not having dysuria.  She has not had a history of UTI.  She drinks about 1 caffeinated drinks per day.  She has not had prior procedures for incontinence.  PMHx: Past Medical History:  Diagnosis Date   Abdominal pain 2014   Depression    Gallstones 2014   Past Surgical History:  Procedure Laterality Date   CHOLECYSTECTOMY     TUBAL LIGATION     Family History  Problem Relation Age of Onset   Cancer Maternal Aunt        lung   Cancer Other        lung - maternal great aunt   Cancer Maternal Aunt        breast   Healthy Mother    Healthy Father    Healthy Sister    Healthy Brother    Healthy Daughter    Healthy Son    Healthy Daughter    Healthy Daughter    Social History   Tobacco Use   Smoking status: Never Smoker   Smokeless tobacco: Never Used  Scientific laboratory technician Use: Never used  Substance Use Topics   Alcohol use: No   Drug use: No    Current Outpatient Medications:    phentermine 15 MG capsule, Take 1 capsule (15 mg total) by mouth every morning., Disp: 30 capsule, Rfl: 3   predniSONE (STERAPRED  UNI-PAK 21 TAB) 10 MG (21) TBPK tablet, 6 day taper; take as directed on package instructions, Disp: 21 tablet, Rfl: 0   topiramate (TOPAMAX) 50 MG tablet, Take 1 tablet (50 mg total) by mouth daily., Disp: 30 tablet, Rfl: 3   traMADol (ULTRAM) 50 MG tablet, Take 1 tablet (50 mg total) by mouth every 6 (six) hours as needed. (Patient not taking: Reported on 06/21/2020), Disp: 8 tablet, Rfl: 0   triamcinolone cream (KENALOG) 0.1 %, Apply 1 application topically 2 (two) times daily. (Patient not taking: Reported on 06/21/2020), Disp: 30 g, Rfl: 0 Allergies: Motrin [ibuprofen]  Review of Systems  Constitutional: Negative for chills, fever and malaise/fatigue.  HENT: Negative for congestion, sinus pain and sore throat.   Eyes: Negative for blurred vision and pain.  Respiratory: Negative for cough and wheezing.   Cardiovascular: Negative for chest pain and leg swelling.  Gastrointestinal: Negative for abdominal pain, constipation, diarrhea, heartburn, nausea and vomiting.  Genitourinary: Negative for dysuria, frequency, hematuria and urgency.  Musculoskeletal: Negative for back pain, joint pain, myalgias and neck pain.  Skin: Negative for itching and rash.  Neurological: Negative for dizziness, tremors and weakness.  Endo/Heme/Allergies: Does not bruise/bleed easily.  Psychiatric/Behavioral: Negative for depression. The patient is not nervous/anxious and does not  have insomnia.     Objective: BP 90/60    Ht 5\' 2"  (1.575 m)    Wt 196 lb (88.9 kg)    LMP 06/09/2020    BMI 35.85 kg/m   Filed Weights   06/21/20 1116  Weight: 196 lb (88.9 kg)   Physical Exam Constitutional:      General: She is not in acute distress.    Appearance: She is well-developed.  HENT:     Head: Normocephalic and atraumatic. No laceration.     Right Ear: Hearing normal.     Left Ear: Hearing normal.     Mouth/Throat:     Pharynx: Uvula midline.  Eyes:     Pupils: Pupils are equal, round, and reactive to light.    Neck:     Thyroid: No thyromegaly.  Cardiovascular:     Rate and Rhythm: Normal rate and regular rhythm.     Heart sounds: No murmur heard.  No friction rub. No gallop.   Pulmonary:     Effort: Pulmonary effort is normal. No respiratory distress.     Breath sounds: Normal breath sounds. No wheezing.  Chest:     Breasts:        Right: No mass, skin change or tenderness.        Left: No mass, skin change or tenderness.  Abdominal:     General: Bowel sounds are normal. There is no distension.     Palpations: Abdomen is soft.     Tenderness: There is no abdominal tenderness. There is no rebound.  Musculoskeletal:        General: Normal range of motion.     Cervical back: Normal range of motion and neck supple.  Neurological:     Mental Status: She is alert and oriented to person, place, and time.     Cranial Nerves: No cranial nerve deficit.  Skin:    General: Skin is warm and dry.  Psychiatric:        Judgment: Judgment normal.  Vitals reviewed.     Assessment: 1. Stress incontinence of urine   2. Cystocele, midline   Options discussed, including pessary, surgery, PT. Plan for Anterior Repair w TVT vag sling  I have had a careful discussion with this patient about all the options available and the risk/benefits of each. I have fully informed this patient that surgery may subject her to a variety of discomforts and risks: She understands that most patients have surgery with little difficulty, but problems can happen ranging from minor to fatal. These include nausea, vomiting, pain, bleeding, infection, poor healing, hernia, or formation of adhesions. Unexpected reactions may occur from any drug or anesthetic given. Unintended injury may occur to other pelvic or abdominal structures such as Fallopian tubes, ovaries, bladder, ureter (tube from kidney to bladder), or bowel. Nerves going from the pelvis to the legs may be injured. Any such injury may require immediate or later  additional surgery to correct the problem. Excessive blood loss requiring transfusion is very unlikely but possible. Dangerous blood clots may form in the legs or lungs. Physical and sexual activity will be restricted in varying degrees for an indeterminate period of time but most often 2-6 weeks.  Finally, she understands that it is impossible to list every possible undesirable effect and that the condition for which surgery is done is not always cured or significantly improved, and in rare cases may be even worse.Ample time was given to answer all questions.  Barnett Applebaum, MD,  [facility], [facility] 06/21/2020  11:20 AM

## 2020-06-21 NOTE — Telephone Encounter (Signed)
Patient should have refills at the pharmacy. Called pt to let her know, but no answer. Ok for [facility] to advise pt if she calls back.

## 2020-06-21 NOTE — Telephone Encounter (Signed)
Patient needs refills on Topamax 50 mg. And Phentermine 15 mg. Sent to [facility].

## 2020-06-21 NOTE — Patient Instructions (Signed)
PRE ADMISSION TESTING For Covid, prior to procedure Friday 9:00-10:00 [facility] entrance (drive up)  Results in 48-72 hours You will not receive notification if test results are negative. If positive for Covid19, your provider will notify you by phone, with additional instructions.   Colporrafa anterior y posterior y procedimiento con sling, cuidados posteriores Anterior and Posterior Colporrhaphy and Sling Procedure, Care After Lea esta informacin sobre cmo cuidarse despus del procedimiento. Su mdico tambin podr darle indicaciones ms especficas. Comunquese con su mdico si tiene problemas o preguntas. Qu puedo esperar despus del procedimiento? Despus del procedimiento, es comn [employer] siguientes sntomas:  Dolor en la zona de la [place].  Secrecin vaginal. Durante este tiempo deber usar un apsito sanitario.  Fatiga. Siga estas indicaciones en su casa: Cuidados de la incisin   Siga las indicaciones del mdico acerca del cuidado de la incisin. Haga lo siguiente: ? Lvese las manos con agua y jabn antes de tocar la zona de la incisin. Use desinfectante para manos si no dispone de [place]. ? Limpie la incisin como se lo haya indicado el mdico. ? No retire los puntos (suturas), la goma para cerrar la piel o las tiras [place]. Es posible que estos cierres cutneos Animal nutritionist en la piel durante 2semanas o ms tiempo. Si los bordes de las tiras adhesivas empiezan a despegarse y Therapist, sports, puede recortar los que estn sueltos. No retire las tiras [facility] por completo a menos que el mdico se lo indique.  Spangle zona de la incisin para detectar signos de infeccin. Est atento a los siguientes signos: ? Dolor, hinchazn o enrojecimiento. ? Lquido o sangre. ? Calor. ? Pus o mal olor.  Controle la incisin todos los das para asegurarse de que el lugar de la incisin no se est abriendo o separando.  No tome baos  de inmersin, no nade ni use el jacuzzi hasta que el mdico lo autorice. Puede ducharse.  Mantenga la zona entre la vagina y el recto (zona del perineo) limpia y Audiological scientist. Asegrese de limpiar la zona despus de cada deposicin y cada vez que orine.  Pregntele a su mdico si puede tomar un bao de asiento o sentarse en una baera con agua limpia y tibia. Actividad  Haga ejercicios suaves a diario como se lo haya indicado el mdico. Es posible que le indiquen que haga caminatas cortas todos los das y que aumente las distancias gradualmente. Pregntele al mdico qu actividades son seguras para usted.  No suba escaleras ms de una o [employer] por da durante la primera Wall, luego aumente lentamente la [place].  No levante ningn objeto que pese ms de 10libras. (4,5kg) o el lmite de peso que le indique el mdico hasta que le diga que puede [place]. Evite empujar o jalar.  Intente no estar de pie [employer].  No tenga relaciones sexuales, no use tampones ni se haga duchas vaginales hasta que el mdico la autorice.  No conduzca ni use maquinaria pesada mientras toma analgsicos recetados. Cmo evitar el estreimiento  A fin de prevenir o tratar el estreimiento mientras toma analgsicos recetados, el mdico puede recomendarle lo siguiente: ? Tomar medicamentos recetados o de [employer]. ? Consumir alimentos ricos en fibra, como frutas y verduras frescas, cereales integrales y frijoles. ? Beber suficiente lquido para mantener la orina clara o de color amarillo plido. ? Limitar el consumo de alimentos ricos en grasas y azcares procesados, como alimentos fritos o dulces. Instrucciones  generales  Es posible que deba hacer ejercicios para el piso plvico (kegels) segn las indicaciones del mdico.  Tome los medicamentos de venta libre y los recetados solamente como se lo haya indicado el mdico.  Consulting civil engineer a todas las visitas de control como se lo haya indicado el mdico.  Esto es importante. Comunquese con un mdico si:  Los medicamentos no Forensic psychologist.  Orina frecuentemente o tiene necesidad urgente de Garment/textile technologist, o no puede vaciar por completo la vejiga.  Tiene sensacin de ardor al [employer].  Le sale lquido o sangre de la incisin.  Advierte un olor ftido o pus que proviene de la incisin.  La incisin est caliente al tacto.  Tiene enrojecimiento, hinchazn o dolor alrededor de la incisin. Solicite ayuda de inmediato si:  Tiene fiebre o siente escalofros.  La incisin se separa o se abre.  No puede orinar.  Tiene dificultad para respirar. Resumen  Despus del procedimiento, es comn Patent attorney, fatiga y secrecin vaginal.  Mantenga la zona entre la vagina y el recto (zona del perineo) limpia y Audiological scientist. Asegrese de limpiar la zona despus de cada deposicin y cada vez que orine.  Siga las indicaciones del mdico respecto de las restricciones en las actividades despus del procedimiento. Esta informacin no tiene Marine scientist el consejo del mdico. Asegrese de hacerle al mdico cualquier pregunta que tenga. Document Revised: 04/04/2017 Document Reviewed: 04/04/2017 Elsevier Patient Education  [place].

## 2020-06-21 NOTE — H&P (View-Only) (Signed)
PRE-OPERATIVE HISTORY AND PHYSICAL EXAM  HPI:  Shelly Williams is a 39 y.o. [phone] Patient's last menstrual period was 06/09/2020.; she is being admitted for surgery related to urinary incontinence.  Patient is not menopausal. She complains of urinary symptoms of urinary incontinence. Patient reports her symptoms began one year ago and have been worsening; and its severity is described as severe.  Her symptoms include incontinence and nocturnal enuresis.  She is having leakage of urine with stressful activities, such as coughing, sneezing, laughter, or exercise.  She is not having leakage of urine based on symptoms of urgency or bladder spasm/discomfort.  Denies Freq and Urgency. She does have Odor, embarrassing. She is not having nocturia, with only 1 episodes per night.  She is using pads or diapers daily for control of symptoms. She is not having dysuria.  She has not had a history of UTI.  She drinks about 1 caffeinated drinks per day.  She has not had prior procedures for incontinence.  PMHx: Past Medical History:  Diagnosis Date  . Abdominal pain 2014  . Depression   . Gallstones 2014   Past Surgical History:  Procedure Laterality Date  . CHOLECYSTECTOMY    . TUBAL LIGATION     Family History  Problem Relation Age of Onset  . Cancer Maternal Aunt        lung  . Cancer Other        lung - maternal great aunt  . Cancer Maternal Aunt        breast  . Healthy Mother   . Healthy Father   . Healthy Sister   . Healthy Brother   . Healthy Daughter   . Healthy Son   . Healthy Daughter   . Healthy Daughter    Social History   Tobacco Use  . Smoking status: Never Smoker  . Smokeless tobacco: Never Used  Vaping Use  . Vaping Use: Never used  Substance Use Topics  . Alcohol use: No  . Drug use: No    Current Outpatient Medications:  .  phentermine 15 MG capsule, Take 1 capsule (15 mg total) by mouth every morning., Disp: 30 capsule, Rfl: 3 .  predniSONE (STERAPRED  UNI-PAK 21 TAB) 10 MG (21) TBPK tablet, 6 day taper; take as directed on package instructions, Disp: 21 tablet, Rfl: 0 .  topiramate (TOPAMAX) 50 MG tablet, Take 1 tablet (50 mg total) by mouth daily., Disp: 30 tablet, Rfl: 3 .  traMADol (ULTRAM) 50 MG tablet, Take 1 tablet (50 mg total) by mouth every 6 (six) hours as needed. (Patient not taking: Reported on 06/21/2020), Disp: 8 tablet, Rfl: 0 .  triamcinolone cream (KENALOG) 0.1 %, Apply 1 application topically 2 (two) times daily. (Patient not taking: Reported on 06/21/2020), Disp: 30 g, Rfl: 0 Allergies: Motrin [ibuprofen]  Review of Systems  Constitutional: Negative for chills, fever and malaise/fatigue.  HENT: Negative for congestion, sinus pain and sore throat.   Eyes: Negative for blurred vision and pain.  Respiratory: Negative for cough and wheezing.   Cardiovascular: Negative for chest pain and leg swelling.  Gastrointestinal: Negative for abdominal pain, constipation, diarrhea, heartburn, nausea and vomiting.  Genitourinary: Negative for dysuria, frequency, hematuria and urgency.  Musculoskeletal: Negative for back pain, joint pain, myalgias and neck pain.  Skin: Negative for itching and rash.  Neurological: Negative for dizziness, tremors and weakness.  Endo/Heme/Allergies: Does not bruise/bleed easily.  Psychiatric/Behavioral: Negative for depression. The patient is not nervous/anxious and does not  have insomnia.     Objective: BP 90/60   Ht 5\' 2"  (1.575 m)   Wt 196 lb (88.9 kg)   LMP 06/09/2020   BMI 35.85 kg/m   Filed Weights   06/21/20 1116  Weight: 196 lb (88.9 kg)   Physical Exam Constitutional:      General: She is not in acute distress.    Appearance: She is well-developed.  HENT:     Head: Normocephalic and atraumatic. No laceration.     Right Ear: Hearing normal.     Left Ear: Hearing normal.     Mouth/Throat:     Pharynx: Uvula midline.  Eyes:     Pupils: Pupils are equal, round, and reactive to light.    Neck:     Thyroid: No thyromegaly.  Cardiovascular:     Rate and Rhythm: Normal rate and regular rhythm.     Heart sounds: No murmur heard.  No friction rub. No gallop.   Pulmonary:     Effort: Pulmonary effort is normal. No respiratory distress.     Breath sounds: Normal breath sounds. No wheezing.  Chest:     Breasts:        Right: No mass, skin change or tenderness.        Left: No mass, skin change or tenderness.  Abdominal:     General: Bowel sounds are normal. There is no distension.     Palpations: Abdomen is soft.     Tenderness: There is no abdominal tenderness. There is no rebound.  Musculoskeletal:        General: Normal range of motion.     Cervical back: Normal range of motion and neck supple.  Neurological:     Mental Status: She is alert and oriented to person, place, and time.     Cranial Nerves: No cranial nerve deficit.  Skin:    General: Skin is warm and dry.  Psychiatric:        Judgment: Judgment normal.  Vitals reviewed.     Assessment: 1. Stress incontinence of urine   2. Cystocele, midline   Options discussed, including pessary, surgery, PT. Plan for Anterior Repair w TVT vag sling  I have had a careful discussion with this patient about all the options available and the risk/benefits of each. I have fully informed this patient that surgery may subject her to a variety of discomforts and risks: She understands that most patients have surgery with little difficulty, but problems can happen ranging from minor to fatal. These include nausea, vomiting, pain, bleeding, infection, poor healing, hernia, or formation of adhesions. Unexpected reactions may occur from any drug or anesthetic given. Unintended injury may occur to other pelvic or abdominal structures such as Fallopian tubes, ovaries, bladder, ureter (tube from kidney to bladder), or bowel. Nerves going from the pelvis to the legs may be injured. Any such injury may require immediate or later  additional surgery to correct the problem. Excessive blood loss requiring transfusion is very unlikely but possible. Dangerous blood clots may form in the legs or lungs. Physical and sexual activity will be restricted in varying degrees for an indeterminate period of time but most often 2-6 weeks.  Finally, she understands that it is impossible to list every possible undesirable effect and that the condition for which surgery is done is not always cured or significantly improved, and in rare cases may be even worse.Ample time was given to answer all questions.  Barnett Applebaum, MD, [facility], [facility] 06/21/2020  11:20 AM

## 2020-06-21 NOTE — Telephone Encounter (Signed)
Patient advised as below.  

## 2020-06-22 ENCOUNTER — Encounter
Admission: RE | Admit: 2020-06-22 | Discharge: 2020-06-22 | Disposition: A | Discharge status: Home / Self Care | Payer: Medicaid Other | Source: Ambulatory Visit | Attending: Obstetrics & Gynecology | Admitting: Obstetrics & Gynecology

## 2020-06-22 HISTORY — DX: Personal history of urinary calculi: Z87.442

## 2020-06-22 NOTE — Patient Instructions (Signed)
Your procedure is scheduled on: Tuesday 06/28/20.  Report to DAY SURGERY DEPARTMENT LOCATED ON 2ND FLOOR MEDICAL MALL ENTRANCE. To find out your arrival time please call [phone] between 1PM - 3PM on Monday 06/27/20.   Remember: Instructions that are not followed completely may result in serious medical risk, up to and including death, or upon the discretion of your surgeon and anesthesiologist your surgery may need to be rescheduled.     __X__ 1. Do not eat food after midnight the night before your procedure.                 No gum chewing or hard candies. You may drink clear liquids up to 2 hours                 before you are scheduled to arrive for your surgery- DO NOT drink clear                 liquids within 2 hours of the start of your surgery.                 Clear Liquids include:  water, apple juice without pulp, clear carbohydrate                 drink such as Clearfast or Gatorade, Black Coffee or Tea (Do not add                 milk or creamer to coffee or tea).  __X__2.  On the morning of surgery brush your teeth with toothpaste and water, you may rinse your mouth with mouthwash if you wish.  Do not swallow any toothpaste or mouthwash.    __X__ 3.  No Alcohol for 24 hours before or after surgery.  __X__ 4.  Do Not Smoke or use e-cigarettes For 24 Hours Prior to Your Surgery.                 Do not use any chewable tobacco products for at least 6 hours prior to                 surgery.  __X__5.  Notify your doctor if there is any change in your medical condition      (cold, fever, infections).      Do NOT wear jewelry, make-up, hairpins, clips or nail polish. Do NOT wear lotions, powders, or perfumes.  Do NOT shave 48 hours prior to surgery. Men may shave face and neck. Do NOT bring valuables to the hospital.     [facility] is not responsible for any belongings or valuables.   Contacts, dentures/partials or body piercings may not be worn into surgery. Bring a  case for your contacts, glasses or hearing aids, a denture cup will be supplied.    Patients discharged the day of surgery will not be allowed to drive home.     __X__ Take these medicines the morning of surgery with A SIP OF WATER:     1. topiramate (TOPAMAX)     __X__ Use CHG Soap/SAGE wipes as directed.  __X__ Stop Anti-inflammatories 7 days before surgery such as Advil, Ibuprofen, Motrin, BC or Goodies Powder, Naprosyn, Naproxen, Aleve, Aspirin, Meloxicam. May take Tylenol if needed for pain or discomfort.   __X__Do not start taking any new herbal supplements or vitamins prior to your procedure.     Wear comfortable clothing (specific to your surgery type) to the hospital.  Plan for stool softeners for home use; pain  medications have a tendency to cause constipation. You can also help prevent constipation by eating foods high in fiber such as fruits and vegetables and drinking plenty of fluids as your diet allows.  After surgery, you can prevent lung complications by doing breathing exercises.Take deep breaths and cough every 1-2 hours. Your doctor may order a device called an Incentive Spirometer to help you take deep breaths.  Please call the [facility] Department at [phone] if you have any questions about these instructions.

## 2020-06-23 ENCOUNTER — Ambulatory Visit (INDEPENDENT_AMBULATORY_CARE_PROVIDER_SITE_OTHER): Payer: Medicaid Other | Admitting: Family Medicine

## 2020-06-23 ENCOUNTER — Encounter: Payer: Self-pay | Admitting: Family Medicine

## 2020-06-23 VITALS — BP 98/58 | HR 82 | Temp 98.6°F | Wt 190.0 lb

## 2020-06-23 DIAGNOSIS — L237 Allergic contact dermatitis due to plants, except food: Secondary | ICD-10-CM

## 2020-06-23 MED ORDER — TRIAMCINOLONE ACETONIDE 0.5 % EX OINT: 1.0000 "application " | TOPICAL_OINTMENT | Freq: Two times a day (BID) | CUTANEOUS | 1 refills | Status: DC

## 2020-06-23 NOTE — Progress Notes (Signed)
Established patient visit   Patient: Shelly Williams   DOB: 1981/10/06   39 y.o. Female  MRN: [id number] Visit Date: 06/23/2020  Today's healthcare provider: Lavon Paganini, MD   Chief Complaint  Patient presents with  . Rash    Started about three weeks ago.  Triamcinolone and Prednisone did not help.    Subjective    HPI HPI    Rash     Additional comments: Started about three weeks ago.  Triamcinolone and Prednisone did not help.        Last edited by Kizzie Furnish, CMA on 06/23/2020  1:32 PM. (History)      About 3 weeks ago [place] noticed what looked like a mosquito bite on the back of the right calf near the knee. The spot was initially small and itchy, but grew over time and developed into several small bumps before spreading up and down the leg and had a burning sensation.  She was seen by Fenton Malling on 9/3 and was assumed to have an allergic contact dermatitis, most likely due to a plant such as poison ivy. She was prescribed 1 week of prednisone which initially helped, but did not get rid of the rash/itching. She has also used triamcinolone cream which helped the itching; she has since ran out of this cream. She has also used a benadryl cream which helps for a moment.   Today the rash is not present in the initial location, but is on the back of the upper thigh and is lower on the calf. It is still itchy, but no longer burns. She does not know of any contact with plants or poison ivy. She does have a dog.  Her daughter has a similar rash on her arm. No one else in the family has a similar rash.      Medications: Outpatient Medications Prior to Visit  Medication Sig  . phentermine 15 MG capsule Take 1 capsule (15 mg total) by mouth every morning.  . topiramate (TOPAMAX) 50 MG tablet Take 1 tablet (50 mg total) by mouth daily.  . predniSONE (STERAPRED UNI-PAK 21 TAB) 10 MG (21) TBPK tablet 6 day taper; take as directed on package instructions (Patient  not taking: Reported on 06/22/2020)  . traMADol (ULTRAM) 50 MG tablet Take 1 tablet (50 mg total) by mouth every 6 (six) hours as needed. (Patient not taking: Reported on 06/21/2020)  . triamcinolone cream (KENALOG) 0.1 % Apply 1 application topically 2 (two) times daily. (Patient not taking: Reported on 06/21/2020)   No facility-administered medications prior to visit.    Review of Systems  Skin: Positive for rash.  Neurological: Negative for numbness.       Objective    BP (!) 98/58 (BP Location: Right Arm, Patient Position: Sitting, Cuff Size: Large)   Pulse 82   Temp 98.6 F (37 C) (Oral)   Wt 190 lb (86.2 kg)   LMP 06/09/2020   SpO2 100%   BMI 34.75 kg/m     Physical Exam Constitutional:      General: She is not in acute distress.    Appearance: She is obese. She is not ill-appearing, toxic-appearing or diaphoretic.  HENT:     Head: Normocephalic.  Pulmonary:     Effort: Pulmonary effort is normal.  Musculoskeletal:     Cervical back: Neck supple.  Skin:    General: Skin is warm and dry.          Comments: Erythematous, raised  lesion with clear borders; patch on upper right thigh  Neurological:     General: No focal deficit present.     Mental Status: She is alert. Mental status is at baseline.  Psychiatric:        Mood and Affect: Mood normal.        Behavior: Behavior normal.        Thought Content: Thought content normal.        Judgment: Judgment normal.            No results found for any visits on 06/23/20.  Assessment & Plan     1. Allergic contact dermatitis due to plants, except food Given the combination of some linear patterns to her rash, this is likely a contact dermatitis. Given that her daughter has a similar rash on her arm and the family has a dog, this is likely related to contact through their dog. Pictures of the rash appear elsewhere.  Meds ordered this encounter  Medications  . triamcinolone ointment (KENALOG) 0.5 %    Sig:  Apply 1 application topically 2 (two) times daily.    Dispense:  60 g    Refill:  1    No follow-ups on file. Follow up as needed, if symptoms worsen or fail to improve.     Rodrigo Ran, MS3   Patient seen along with MS3 student Rodrigo Ran. I personally evaluated this patient along with the student, and verified all aspects of the history, physical exam, and medical decision making as documented by the student. I agree with the student's documentation and have made all necessary edits.  Kanye Depree, Dionne Bucy, MD, MPH [facility]

## 2020-06-23 NOTE — Patient Instructions (Signed)
Contact Dermatitis Dermatitis is redness, soreness, and swelling (inflammation) of the skin. Contact dermatitis is a reaction to certain substances that touch the skin. Many different substances can cause contact dermatitis. There are two types of contact dermatitis:  Irritant contact dermatitis. This type is caused by something that irritates your skin, such as having dry hands from washing them too often with soap. This type does not require previous exposure to the substance for a reaction to occur. This is the most common type.  Allergic contact dermatitis. This type is caused by a substance that you are allergic to, such as poison ivy. This type occurs when you have been exposed to the substance (allergen) and develop a sensitivity to it. Dermatitis may develop soon after your first exposure to the allergen, or it may not develop until the next time you are exposed and every time thereafter. What are the causes? Irritant contact dermatitis is most commonly caused by exposure to:  Makeup.  Soaps.  Detergents.  Bleaches.  Acids.  Metal salts, such as nickel. Allergic contact dermatitis is most commonly caused by exposure to:  Poisonous plants.  Chemicals.  Jewelry.  Latex.  Medicines.  Preservatives in products, such as clothing. What increases the risk? You are more likely to develop this condition if you have:  A job that exposes you to irritants or allergens.  Certain medical conditions, such as asthma or eczema. What are the signs or symptoms? Symptoms of this condition may occur on your body anywhere the irritant has touched you or is touched by you.  Symptoms include: ? Dryness or flaking. ? Redness. ? Cracks. ? Itching. ? Pain or a burning feeling. ? Blisters. ? Drainage of small amounts of blood or clear fluid from skin cracks. With allergic contact dermatitis, there may also be swelling in areas such as the eyelids, mouth, or genitals. How is this  diagnosed? This condition is diagnosed with a medical history and physical exam.  A patch skin test may be performed to help determine the cause.  If the condition is related to your job, you may need to see an occupational medicine specialist. How is this treated? This condition is treated by checking for the cause of the reaction and protecting your skin from further contact. Treatment may also include:  Steroid creams or ointments. Oral steroid medicines may be needed in more severe cases.  Antibiotic medicines or antibacterial ointments, if a skin infection is present.  Antihistamine lotion or an antihistamine taken by mouth to ease itching.  A bandage (dressing). Follow these instructions at home: Skin care  Moisturize your skin as needed.  Apply cool compresses to the affected areas.  Try applying baking soda paste to your skin. Stir water into baking soda until it reaches a paste-like consistency.  Do not scratch your skin, and avoid friction to the affected area.  Avoid the use of soaps, perfumes, and dyes. Medicines  Take or apply over-the-counter and prescription medicines only as told by your health care provider.  If you were prescribed an antibiotic medicine, take or apply the antibiotic as told by your health care provider. Do not stop using the antibiotic even if your condition improves. Bathing  Try taking a bath with: ? Epsom salts. Follow the instructions on the packaging. You can get these at your local pharmacy or grocery store. ? Baking soda. Pour a small amount into the bath as directed by your health care provider. ? Colloidal oatmeal. Follow the instructions on the   packaging. You can get this at your local pharmacy or grocery store.  Bathe less frequently, such as every other day.  Bathe in lukewarm water. Avoid using hot water. Bandage care  If you were given a bandage (dressing), change it as told by your health care provider.  Wash your hands  with soap and water before and after you change your dressing. If soap and water are not available, use hand sanitizer. General instructions  Avoid the substance that caused your reaction. If you do not know what caused it, keep a journal to try to track what caused it. Write down: ? What you eat. ? What cosmetic products you use. ? What you drink. ? What you wear in the affected area. This includes jewelry.  Check the affected areas every day for signs of infection. Check for: ? More redness, swelling, or pain. ? More fluid or blood. ? Warmth. ? Pus or a bad smell.  Keep all follow-up visits as told by your health care provider. This is important. Contact a health care provider if:  Your condition does not improve with treatment.  Your condition gets worse.  You have signs of infection such as swelling, tenderness, redness, soreness, or warmth in the affected area.  You have a fever.  You have new symptoms. Get help right away if:  You have a severe headache, neck pain, or neck stiffness.  You vomit.  You feel very sleepy.  You notice red streaks coming from the affected area.  Your bone or joint underneath the affected area becomes painful after the skin has healed.  The affected area turns darker.  You have difficulty breathing. Summary  Dermatitis is redness, soreness, and swelling (inflammation) of the skin. Contact dermatitis is a reaction to certain substances that touch the skin.  Symptoms of this condition may occur on your body anywhere the irritant has touched you or is touched by you.  This condition is treated by figuring out what caused the reaction and protecting your skin from further contact. Treatment may also include medicines and skin care.  Avoid the substance that caused your reaction. If you do not know what caused it, keep a journal to try to track what caused it.  Contact a health care provider if your condition gets worse or you have signs  of infection such as swelling, tenderness, redness, soreness, or warmth in the affected area. This information is not intended to replace advice given to you by your health care provider. Make sure you discuss any questions you have with your health care provider. Document Revised: 01/14/2019 Document Reviewed: 04/09/2018 Elsevier Patient Education  2020 [employer]  

## 2020-06-24 ENCOUNTER — Other Ambulatory Visit
Admission: RE | Admit: 2020-06-24 | Discharge: 2020-06-24 | Disposition: A | Discharge status: Home / Self Care | Payer: Medicaid Other | Source: Ambulatory Visit | Attending: Obstetrics & Gynecology | Admitting: Obstetrics & Gynecology

## 2020-06-24 ENCOUNTER — Other Ambulatory Visit: Payer: Self-pay

## 2020-06-24 DIAGNOSIS — Z01812 Encounter for preprocedural laboratory examination: Secondary | ICD-10-CM | POA: Insufficient documentation

## 2020-06-24 DIAGNOSIS — Z20822 Contact with and (suspected) exposure to covid-19: Secondary | ICD-10-CM | POA: Insufficient documentation

## 2020-06-24 LAB — TYPE AND SCREEN
ABO/RH(D): A POS
Antibody Screen: NEGATIVE

## 2020-06-24 LAB — CBC
HCT: 34.6 % — ABNORMAL LOW (ref 36.0–46.0)
Hemoglobin: 11.7 g/dL — ABNORMAL LOW (ref 12.0–15.0)
MCH: 27.6 pg (ref 26.0–34.0)
MCHC: 33.8 g/dL (ref 30.0–36.0)
MCV: 81.6 fL (ref 80.0–100.0)
Platelets: 126 10*3/uL — ABNORMAL LOW (ref 150–400)
RBC: 4.24 MIL/uL (ref 3.87–5.11)
RDW: 15.1 % (ref 11.5–15.5)
WBC: 7.3 10*3/uL (ref 4.0–10.5)
nRBC: 0 % (ref 0.0–0.2)

## 2020-06-25 LAB — SARS CORONAVIRUS 2 (TAT 6-24 HRS): SARS Coronavirus 2: NEGATIVE

## 2020-06-28 ENCOUNTER — Other Ambulatory Visit: Payer: Self-pay

## 2020-06-28 ENCOUNTER — Encounter: Payer: Self-pay | Admitting: Obstetrics & Gynecology

## 2020-06-28 ENCOUNTER — Ambulatory Visit: Payer: Medicaid Other | Admitting: Registered Nurse

## 2020-06-28 ENCOUNTER — Encounter
Admission: RE | Disposition: A | Discharge status: Home / Self Care | Payer: Self-pay | Source: Home / Self Care | Attending: Obstetrics & Gynecology

## 2020-06-28 ENCOUNTER — Ambulatory Visit
Admission: RE | Admit: 2020-06-28 | Discharge: 2020-06-28 | Disposition: A | Discharge status: Home / Self Care | Payer: Medicaid Other | Attending: Obstetrics & Gynecology | Admitting: Obstetrics & Gynecology

## 2020-06-28 DIAGNOSIS — N8111 Cystocele, midline: Secondary | ICD-10-CM | POA: Diagnosis not present

## 2020-06-28 DIAGNOSIS — N393 Stress incontinence (female) (male): Secondary | ICD-10-CM | POA: Diagnosis not present

## 2020-06-28 DIAGNOSIS — Z6834 Body mass index (BMI) 34.0-34.9, adult: Secondary | ICD-10-CM | POA: Insufficient documentation

## 2020-06-28 DIAGNOSIS — Z79899 Other long term (current) drug therapy: Secondary | ICD-10-CM | POA: Insufficient documentation

## 2020-06-28 DIAGNOSIS — N814 Uterovaginal prolapse, unspecified: Secondary | ICD-10-CM | POA: Insufficient documentation

## 2020-06-28 DIAGNOSIS — Z886 Allergy status to analgesic agent status: Secondary | ICD-10-CM | POA: Insufficient documentation

## 2020-06-28 DIAGNOSIS — E669 Obesity, unspecified: Secondary | ICD-10-CM | POA: Insufficient documentation

## 2020-06-28 HISTORY — PX: CYSTOSCOPY: SHX5120

## 2020-06-28 HISTORY — PX: BLADDER SUSPENSION: SHX72

## 2020-06-28 HISTORY — PX: CYSTOCELE REPAIR: SHX163

## 2020-06-28 LAB — ABO/RH: ABO/RH(D): A POS

## 2020-06-28 LAB — POCT PREGNANCY, URINE: Preg Test, Ur: NEGATIVE

## 2020-06-28 SURGERY — COLPORRHAPHY, ANTERIOR, FOR CYSTOCELE REPAIR
Anesthesia: General

## 2020-06-28 MED ORDER — ESTROGENS, CONJUGATED 0.625 MG/GM VA CREA
TOPICAL_CREAM | VAGINAL | Status: AC
  Filled 2020-06-28: qty 30

## 2020-06-28 MED ORDER — FENTANYL CITRATE (PF) 100 MCG/2ML IJ SOLN: 25.0000 ug | INTRAMUSCULAR | Status: DC | PRN

## 2020-06-28 MED ORDER — ONDANSETRON HCL 4 MG/2ML IJ SOLN
INTRAMUSCULAR | Status: DC | PRN
  Administered 2020-06-28: 4 mg via INTRAVENOUS

## 2020-06-28 MED ORDER — KETOROLAC TROMETHAMINE 30 MG/ML IJ SOLN
INTRAMUSCULAR | Status: DC | PRN
  Administered 2020-06-28: 30 mg via INTRAVENOUS

## 2020-06-28 MED ORDER — LACTATED RINGERS IV SOLN: INTRAVENOUS | Status: DC

## 2020-06-28 MED ORDER — MORPHINE SULFATE (PF) 2 MG/ML IV SOLN: 1.0000 mg | INTRAVENOUS | Status: DC | PRN

## 2020-06-28 MED ORDER — CHLORHEXIDINE GLUCONATE 0.12 % MT SOLN: 15.0000 mL | Freq: Once | OROMUCOSAL | Status: AC

## 2020-06-28 MED ORDER — LIDOCAINE HCL (PF) 2 % IJ SOLN
INTRAMUSCULAR | Status: AC
  Filled 2020-06-28: qty 5

## 2020-06-28 MED ORDER — ONDANSETRON HCL 4 MG/2ML IJ SOLN
INTRAMUSCULAR | Status: AC
  Filled 2020-06-28: qty 2

## 2020-06-28 MED ORDER — PROMETHAZINE HCL 25 MG/ML IJ SOLN: 6.2500 mg | INTRAMUSCULAR | Status: DC | PRN

## 2020-06-28 MED ORDER — PHENYLEPHRINE HCL (PRESSORS) 10 MG/ML IV SOLN
INTRAVENOUS | Status: DC | PRN
  Administered 2020-06-28 (×4): 100 ug via INTRAVENOUS

## 2020-06-28 MED ORDER — ACETAMINOPHEN 10 MG/ML IV SOLN
INTRAVENOUS | Status: DC | PRN
  Administered 2020-06-28: 1000 mg via INTRAVENOUS

## 2020-06-28 MED ORDER — LIDOCAINE-EPINEPHRINE 1 %-1:100000 IJ SOLN
INTRAMUSCULAR | Status: DC | PRN
  Administered 2020-06-28: 40 mL

## 2020-06-28 MED ORDER — FAMOTIDINE 20 MG PO TABS
ORAL_TABLET | ORAL | Status: AC
  Administered 2020-06-28: 20 mg via ORAL
  Filled 2020-06-28: qty 1

## 2020-06-28 MED ORDER — DEXMEDETOMIDINE HCL 200 MCG/2ML IV SOLN
INTRAVENOUS | Status: DC | PRN
  Administered 2020-06-28: 20 ug via INTRAVENOUS

## 2020-06-28 MED ORDER — MIDAZOLAM HCL 2 MG/2ML IJ SOLN
INTRAMUSCULAR | Status: DC | PRN
  Administered 2020-06-28: 2 mg via INTRAVENOUS

## 2020-06-28 MED ORDER — CHLORHEXIDINE GLUCONATE 0.12 % MT SOLN
OROMUCOSAL | Status: AC
  Administered 2020-06-28: 15 mL via OROMUCOSAL
  Filled 2020-06-28: qty 15

## 2020-06-28 MED ORDER — CEFAZOLIN SODIUM-DEXTROSE 2-4 GM/100ML-% IV SOLN
INTRAVENOUS | Status: AC
  Filled 2020-06-28: qty 100

## 2020-06-28 MED ORDER — MIDAZOLAM HCL 2 MG/2ML IJ SOLN
INTRAMUSCULAR | Status: AC
  Filled 2020-06-28: qty 2

## 2020-06-28 MED ORDER — OXYCODONE HCL 5 MG/5ML PO SOLN: 5.0000 mg | Freq: Once | ORAL | Status: DC | PRN

## 2020-06-28 MED ORDER — FENTANYL CITRATE (PF) 100 MCG/2ML IJ SOLN
INTRAMUSCULAR | Status: AC
  Filled 2020-06-28: qty 2

## 2020-06-28 MED ORDER — LIDOCAINE-EPINEPHRINE 1 %-1:100000 IJ SOLN
INTRAMUSCULAR | Status: AC
  Filled 2020-06-28: qty 2

## 2020-06-28 MED ORDER — SCOPOLAMINE 1 MG/3DAYS TD PT72
MEDICATED_PATCH | TRANSDERMAL | Status: AC
  Filled 2020-06-28: qty 1

## 2020-06-28 MED ORDER — DEXAMETHASONE SODIUM PHOSPHATE 10 MG/ML IJ SOLN
INTRAMUSCULAR | Status: DC | PRN
  Administered 2020-06-28: 8 mg via INTRAVENOUS

## 2020-06-28 MED ORDER — KETAMINE HCL 10 MG/ML IJ SOLN
INTRAMUSCULAR | Status: DC | PRN
  Administered 2020-06-28: 20 mg via INTRAVENOUS

## 2020-06-28 MED ORDER — OXYCODONE-ACETAMINOPHEN 5-325 MG PO TABS
1.0000 | ORAL_TABLET | ORAL | 0 refills | Status: DC | PRN
Start: 2020-06-28 — End: 2020-08-10

## 2020-06-28 MED ORDER — ROCURONIUM BROMIDE 10 MG/ML (PF) SYRINGE
PREFILLED_SYRINGE | INTRAVENOUS | Status: AC
  Filled 2020-06-28: qty 10

## 2020-06-28 MED ORDER — ESTROGENS, CONJUGATED 0.625 MG/GM VA CREA
TOPICAL_CREAM | VAGINAL | Status: DC | PRN
  Administered 2020-06-28: 1 via VAGINAL

## 2020-06-28 MED ORDER — SUGAMMADEX SODIUM 200 MG/2ML IV SOLN
INTRAVENOUS | Status: DC | PRN
  Administered 2020-06-28: 200 mg via INTRAVENOUS

## 2020-06-28 MED ORDER — DEXAMETHASONE SODIUM PHOSPHATE 10 MG/ML IJ SOLN
INTRAMUSCULAR | Status: AC
  Filled 2020-06-28: qty 1

## 2020-06-28 MED ORDER — ORAL CARE MOUTH RINSE: 15.0000 mL | Freq: Once | OROMUCOSAL | Status: AC

## 2020-06-28 MED ORDER — KETOROLAC TROMETHAMINE 30 MG/ML IJ SOLN
INTRAMUSCULAR | Status: AC
  Filled 2020-06-28: qty 1

## 2020-06-28 MED ORDER — LIDOCAINE HCL (CARDIAC) PF 100 MG/5ML IV SOSY
PREFILLED_SYRINGE | INTRAVENOUS | Status: DC | PRN
  Administered 2020-06-28: 80 mg via INTRAVENOUS

## 2020-06-28 MED ORDER — OXYCODONE HCL 5 MG PO TABS: 5.0000 mg | ORAL_TABLET | Freq: Once | ORAL | Status: DC | PRN

## 2020-06-28 MED ORDER — PROPOFOL 10 MG/ML IV BOLUS
INTRAVENOUS | Status: AC
  Filled 2020-06-28: qty 40

## 2020-06-28 MED ORDER — FENTANYL CITRATE (PF) 100 MCG/2ML IJ SOLN
INTRAMUSCULAR | Status: DC | PRN
Start: 2020-06-28 — End: 2020-06-28
  Administered 2020-06-28 (×2): 50 ug via INTRAVENOUS

## 2020-06-28 MED ORDER — FAMOTIDINE 20 MG PO TABS: 20.0000 mg | ORAL_TABLET | Freq: Once | ORAL | Status: AC

## 2020-06-28 MED ORDER — ACETAMINOPHEN 650 MG RE SUPP
650.0000 mg | RECTAL | Status: DC | PRN
  Filled 2020-06-28: qty 1

## 2020-06-28 MED ORDER — ROCURONIUM BROMIDE 100 MG/10ML IV SOLN
INTRAVENOUS | Status: DC | PRN
  Administered 2020-06-28: 50 mg via INTRAVENOUS

## 2020-06-28 MED ORDER — PROPOFOL 10 MG/ML IV BOLUS
INTRAVENOUS | Status: DC | PRN
  Administered 2020-06-28: 150 mg via INTRAVENOUS

## 2020-06-28 MED ORDER — PROPOFOL 500 MG/50ML IV EMUL
INTRAVENOUS | Status: DC | PRN
  Administered 2020-06-28: 175 ug/kg/min via INTRAVENOUS

## 2020-06-28 MED ORDER — OXYCODONE-ACETAMINOPHEN 5-325 MG PO TABS: 1.0000 | ORAL_TABLET | ORAL | Status: DC | PRN

## 2020-06-28 MED ORDER — ACETAMINOPHEN 325 MG PO TABS: 650.0000 mg | ORAL_TABLET | ORAL | Status: DC | PRN

## 2020-06-28 MED ORDER — CEFAZOLIN SODIUM-DEXTROSE 2-3 GM-%(50ML) IV SOLR
INTRAVENOUS | Status: DC | PRN
  Administered 2020-06-28: 2 g via INTRAVENOUS

## 2020-06-28 MED ORDER — ACETAMINOPHEN 10 MG/ML IV SOLN
INTRAVENOUS | Status: AC
  Filled 2020-06-28: qty 100

## 2020-06-28 MED ORDER — PROPOFOL 500 MG/50ML IV EMUL
INTRAVENOUS | Status: AC
  Filled 2020-06-28: qty 100

## 2020-06-28 MED ORDER — DEXMEDETOMIDINE (PRECEDEX) IN NS 20 MCG/5ML (4 MCG/ML) IV SYRINGE
PREFILLED_SYRINGE | INTRAVENOUS | Status: AC
  Filled 2020-06-28: qty 10

## 2020-06-28 MED ORDER — CEFAZOLIN (ANCEF) 1 G IV SOLR
1.0000 g | INTRAVENOUS | Status: DC
  Filled 2020-06-28: qty 1

## 2020-06-28 MED ORDER — SCOPOLAMINE 1 MG/3DAYS TD PT72
MEDICATED_PATCH | TRANSDERMAL | Status: DC | PRN
  Administered 2020-06-28: 1 via TRANSDERMAL

## 2020-06-28 SURGICAL SUPPLY — 65 items
ADH SKN CLS APL DERMABOND .7 (GAUZE/BANDAGES/DRESSINGS) ×2
BAG DRN RND TRDRP ANRFLXCHMBR (UROLOGICAL SUPPLIES) ×2
BAG URINE DRAIN 2000ML AR STRL (UROLOGICAL SUPPLIES) ×4 IMPLANT
BLADE SURG 15 STRL LF DISP TIS (BLADE) ×2 IMPLANT
BLADE SURG 15 STRL SS (BLADE) ×4
BLADE SURG SZ10 CARB STEEL (BLADE) ×4 IMPLANT
BNDG GAUZE 4.5X4.1 6PLY STRL (MISCELLANEOUS) ×4 IMPLANT
CANISTER SUCT 1200ML W/VALVE (MISCELLANEOUS) ×4 IMPLANT
CATH FOLEY 2WAY  5CC 16FR (CATHETERS) ×2
CATH FOLEY 2WAY 5CC 16FR (CATHETERS) ×2
CATH URTH 16FR FL 2W BLN LF (CATHETERS) ×2 IMPLANT
COVER WAND RF STERILE (DRAPES) ×4 IMPLANT
DERMABOND ADVANCED (GAUZE/BANDAGES/DRESSINGS) ×2
DERMABOND ADVANCED .7 DNX12 (GAUZE/BANDAGES/DRESSINGS) ×2 IMPLANT
DRAPE 3/4 80X56 (DRAPES) ×4 IMPLANT
DRAPE LAPAROTOMY 100X77 ABD (DRAPES) ×4 IMPLANT
DRAPE PERI LITHO V/GYN (MISCELLANEOUS) ×4 IMPLANT
DRAPE UNDER BUTTOCK W/FLU (DRAPES) ×4 IMPLANT
ELECT CAUTERY BLADE 6.4 (BLADE) ×4 IMPLANT
ELECT REM PT RETURN 9FT ADLT (ELECTROSURGICAL) ×4
ELECTRODE REM PT RTRN 9FT ADLT (ELECTROSURGICAL) ×2 IMPLANT
GAUZE 4X4 16PLY RFD (DISPOSABLE) ×4 IMPLANT
GAUZE PACK 2X3YD (PACKING) ×4 IMPLANT
GAUZE PACKING 1/2X5YD (GAUZE/BANDAGES/DRESSINGS) ×4 IMPLANT
GLOVE BIO SURGEON STRL SZ8 (GLOVE) ×8 IMPLANT
GOWN STRL REUS W/ TWL LRG LVL3 (GOWN DISPOSABLE) ×6 IMPLANT
GOWN STRL REUS W/ TWL XL LVL3 (GOWN DISPOSABLE) ×2 IMPLANT
GOWN STRL REUS W/TWL LRG LVL3 (GOWN DISPOSABLE) ×12
GOWN STRL REUS W/TWL XL LVL3 (GOWN DISPOSABLE) ×4
IV LACTATED RINGERS 1000ML (IV SOLUTION) ×4 IMPLANT
IV NS 1000ML (IV SOLUTION) ×4
IV NS 1000ML BAXH (IV SOLUTION) ×2 IMPLANT
KIT TURNOVER CYSTO (KITS) ×4 IMPLANT
KIT TURNOVER KIT A (KITS) ×4 IMPLANT
LABEL OR SOLS (LABEL) ×4 IMPLANT
NDL HPO THNWL 1X22GA REG BVL (NEEDLE) ×2 IMPLANT
NDL SAFETY ECLIPSE 18X1.5 (NEEDLE) ×2 IMPLANT
NEEDLE HYPO 18GX1.5 SHARP (NEEDLE) ×4
NEEDLE HYPO 22GX1.5 SAFETY (NEEDLE) ×4 IMPLANT
NEEDLE SAFETY 22GX1 (NEEDLE) ×4
NEEDLE SPNL 22GX3.5 QUINCKE BK (NEEDLE) ×4 IMPLANT
NS IRRIG 500ML POUR BTL (IV SOLUTION) ×4 IMPLANT
PACK BASIN MINOR (MISCELLANEOUS) ×4 IMPLANT
PAD OB MATERNITY 4.3X12.25 (PERSONAL CARE ITEMS) ×4 IMPLANT
PAD PREP 24X41 OB/GYN DISP (PERSONAL CARE ITEMS) ×4 IMPLANT
RETRACTOR PHONTONGUIDE ADAPT (ADAPTER) IMPLANT
SET CYSTO W/LG BORE CLAMP LF (SET/KITS/TRAYS/PACK) ×4 IMPLANT
SLING TVT EXACT (Sling) ×4 IMPLANT
SOL PREP PVP 2OZ (MISCELLANEOUS)
SOLUTION PREP PVP 2OZ (MISCELLANEOUS) IMPLANT
STRAP SAFETY 5IN WIDE (MISCELLANEOUS) ×4 IMPLANT
SURGILUBE 2OZ TUBE FLIPTOP (MISCELLANEOUS) ×4 IMPLANT
SUT ETHIBOND NAB CT1 #1 30IN (SUTURE) ×16 IMPLANT
SUT VIC AB 0 CT1 27 (SUTURE) ×4
SUT VIC AB 0 CT1 27XCR 8 STRN (SUTURE) ×2 IMPLANT
SUT VIC AB 2-0 CT1 (SUTURE) ×8 IMPLANT
SUT VIC AB 2-0 CT1 27 (SUTURE) ×4
SUT VIC AB 2-0 CT1 TAPERPNT 27 (SUTURE) ×2 IMPLANT
SUT VIC AB 3-0 SH 27 (SUTURE) ×4
SUT VIC AB 3-0 SH 27X BRD (SUTURE) ×2 IMPLANT
SUT VICRYL+ 3-0 36IN CT-1 (SUTURE) ×4 IMPLANT
SYR 10ML LL (SYRINGE) ×4 IMPLANT
SYR CONTROL 10ML LL (SYRINGE) ×4 IMPLANT
SYRINGE IRR TOOMEY STRL 70CC (SYRINGE) ×4 IMPLANT
TAPE TRANSPORE STRL 2 31045 (GAUZE/BANDAGES/DRESSINGS) ×4 IMPLANT

## 2020-06-28 NOTE — Anesthesia Procedure Notes (Signed)
Procedure Name: Intubation Date/Time: 06/28/2020 10:33 AM Performed by: Lia Foyer, CRNA Pre-anesthesia Checklist: Patient identified, Emergency Drugs available, Suction available and Patient being monitored Patient Re-evaluated:Patient Re-evaluated prior to induction Oxygen Delivery Method: Circle system utilized Preoxygenation: Pre-oxygenation with 100% oxygen Induction Type: IV induction Ventilation: Mask ventilation without difficulty Laryngoscope Size: McGraph and 3 Grade View: Grade I Tube type: Oral Tube size: 7.0 mm Number of attempts: 1 Airway Equipment and Method: Stylet,  Oral airway and Video-laryngoscopy Placement Confirmation: ETT inserted through vocal cords under direct vision,  positive ETCO2 and breath sounds checked- equal and bilateral Secured at: 19 cm Tube secured with: Tape Dental Injury: Teeth and Oropharynx as per pre-operative assessment

## 2020-06-28 NOTE — Discharge Instructions (Signed)
Urethral Vaginal Sling, Care After This sheet gives you information about how to care for yourself after your procedure. Your health care provider may also give you more specific instructions. If you have problems or questions, contact your health care provider. What can I expect after the procedure? After the procedure:  It is common to have some abdominal pain. Your health care provider will give you pain medicines for this.  You may also have a gauze packing in the vagina to prevent bleeding. This will be removed in 1-2 days. Follow these instructions at home: Incision care   Follow instructions from your health care provider about how to take care of your abdominal incision. Make sure you: ? Wash your hands with soap and water before you change your bandage (dressing). If soap and water are not available, use hand sanitizer. ? Change your dressing as told by your health care provider. ? Leave stitches (sutures), skin glue, or adhesive strips in place. These skin closures may need to stay in place for 2 weeks or longer. If adhesive strip edges start to loosen and curl up, you may trim the loose edges. Do not remove adhesive strips completely unless your health care provider tells you to do that.  Check your incision area every day for signs of infection. Check for: ? Redness, swelling, or pain. ? Fluid or blood. ? Warmth. ? Pus or a bad smell. Activity  Get plenty of rest.  Limit exercise and activities as told by your health care provider.  Do not lift anything that is heavier than 5 pounds (2.3 kg) until your health care provider says that it is safe.  Do not douche, use tampons, or have sexual intercourse for 6 weeks after your procedure or as told by your health care provider.  Do not drive or use heavy machinery while taking prescription pain medicine.  Return to your normal activities as told by your health care provider. Ask your health care provider what activities are  safe for you. General instructions  If you have a urinary catheter in place, follow instructions from your health care provider about how to empty the catheter bag.  Do not take baths, swim, or use a hot tub until your health care provider approves. Ask your health care provider if you may take showers. You may only be allowed to take sponge baths.  Take over-the-counter and prescription medicines only as told by your health care provider. Do not take aspirin because it can cause bleeding.  Do not use any products that contain nicotine or tobacco, such as cigarettes and e-cigarettes. These can delay bone healing. If you need help quitting, ask your health care provider.  You may resume your usual diet. Eat a well-balanced diet.  To prevent or treat constipation while you are taking prescription pain medicine, your health care provider may recommend that you: ? Drink enough fluid to keep your urine pale yellow. ? Take over-the-counter or prescription medicines. ? Eat foods that are high in fiber, such as fresh fruits and vegetables, whole grains, and beans. ? Limit foods that are high in fat and processed sugars, such as fried and sweet foods.  Avoid straining when having a bowel movement.  Keep all follow-up visits as told by your health care provider. This is important. Contact a health care provider if:  You have a heavy or bad smelling vaginal discharge.  You have bruising in the vaginal area.  You have pain that is not controlled with medicines.  Your incision feels warm to the touch.  You have redness, swelling, or pain around your incision.  You have pus or a bad smell coming from your incision.  You have fluid or blood coming from your incision.  You feel faint or light-headed.  You have a rash. Get help right away if:  You have a fever.  You faint.  You have shortness of breath.  You have chest, abdominal, or leg pain.  You have pain when urinating or cannot  urinate.  Your catheter is still in your bladder and it becomes blocked.  You have vaginal bleeding.  You have swelling, redness, and pain in the vaginal area. Summary  After the procedure, it is common to have some abdominal pain. Your health care provider will give you pain medicines for this.  Follow instructions from your health care provider about how to take care of your incision.  Limit exercise and activities as told by your health care provider.  Contact your health care provider if you have any signs of infection after your surgery. This information is not intended to replace advice given to you by your health care provider. Make sure you discuss any questions you have with your health care provider. Document Revised: 09/06/2017 Document Reviewed: 01/02/2017 Elsevier Patient Education  2020 [place]   1) The drugs that you were given will stay in your system until tomorrow so for the next 24 hours you should not:  A) Drive an automobile B) Make any legal decisions C) Drink any alcoholic beverage   2) You may resume regular meals tomorrow.  Today it is better to start with liquids and gradually work up to solid foods.  You may eat anything you prefer, but it is better to start with liquids, then soup and crackers, and gradually work up to solid foods.   3) Please notify your doctor immediately if you have any unusual bleeding, trouble breathing, redness and pain at the surgery site, drainage, fever, or pain not relieved by medication.    4) Additional Instructions:        Please contact your physician with any problems or Same Day Surgery at [phone], Monday through Friday 6 am to 4 pm, or Portal at [facility] number at [phone].

## 2020-06-28 NOTE — Interval H&P Note (Signed)
History and Physical Interval Note:  06/28/2020 9:55 AM  Shelly Williams  has presented today for surgery, with the diagnosis of Stress incontinence of urine N39.3 Cystocele, midline N81.11.  The various methods of treatment have been discussed with the patient and family. After consideration of risks, benefits and other options for treatment, the patient has consented to  Procedure(s): ANTERIOR REPAIR (CYSTOCELE) (N/A) TRANSVAGINAL TAPE (TVT) PROCEDURE - TVT EXACT (N/A) as a surgical intervention.  The patient's history has been reviewed, patient examined, no change in status, stable for surgery.  I have reviewed the patient's chart and labs.  Questions were answered to the patient's satisfaction.     Hoyt Koch

## 2020-06-28 NOTE — Op Note (Signed)
Operative Note:  PRE-OP DIAGNOSIS: Stress incontinence of urine N39.3 Cystocele, midline N81.11   POST-OP DIAGNOSIS: Stress incontinence of urine N39.3 Cystocele, midline N81.11   PROCEDURE: Procedure(s): ANTERIOR REPAIR (CYSTOCELE) PUBOVAGINAL SLING  PROCEDURE - TVT EXACT CYSTOSCOPY  SURGEON: Barnett Applebaum, MD, FACOG  ASSISTANT: Student K. Paich   ANESTHESIA: General endotracheal anesthesia  ESTIMATED BLOOD LOSS: 50 mL  SPECIMENS: none.  COMPLICATIONS: None  DISPOSITION: stable to PACU  FINDINGS: Cystocele and min uterine prolapse  PROCEDURE:   Patient was taken to the OR where she was placed in dorsal lithotomy in [place] stirrups. She was prepped and draped in the usual sterile fashion. A timeout was performed. Foley is placed into bladder. A speculum was placed inside the vagina. Above findings noted.  Anterior colporrhaphy is performed.  Allis clamps are placed along the anterior vaginal wall, lidocaine is used to infiltrate the plane, and incision is made midline vertical.  Endopelvic fascia is dissected free of vaginal mucosa.    The retropubic space is carefully dissected both with sharp and blunt dissection.  The rigid catheter guide is placed in the foley with appropriate deviation of the bladder while placing the sling.  Using the TVT Exact device and material, the trocar is placed through the right then the left retropubic space and then through an exit incision in the mons pubis.  Cystoscopy is performed with saline distension of the bladder with no perforations noted.  300 mL is left in bladder.  The TVT sling is then pulled upward until contact is made along the bladder neck, and a Kelly clamp is used to ensure it is not cinched too tightly.  Suprapubic pressure is performed to ensure no leakage of urine.  The sleeves are then removed from the sling.  Incisions are closed with Dermabond.  Fascia is plicated w interrupted vicryl sutures.  Tissue is plicated over the  sling as well to provide additional support.  Excess mucosa is excised.  Vaginal incision is closed with a running locking Vicryl suture.  Excellent hemostasis was noted at the end of the case.  A vaginal packing sponge w premarin vaginal cream is placed vaginally.  A Foley catheter is left in  place inside her bladder. Clear, yellow urine was noted. All instrument needle and lap counts were correct x 2. Patient was awakened taken to recovery room in stable condition.  Barnett Applebaum, MD, [facility], [facility] 06/28/2020  11:46 AM

## 2020-06-28 NOTE — Anesthesia Preprocedure Evaluation (Addendum)
Anesthesia Evaluation  Patient identified by MRN, date of birth, ID band Patient awake    Reviewed: Allergy & Precautions, H&P , NPO status , Patient's Chart, lab work & pertinent test results  History of Anesthesia Complications Negative for: history of anesthetic complications  Airway Mallampati: II  TM Distance: >3 FB Neck ROM: full    Dental  (+) Teeth Intact   Pulmonary neg pulmonary ROS, neg sleep apnea, neg COPD,    breath sounds clear to auscultation       Cardiovascular (-) angina(-) Past MI and (-) Cardiac Stents negative cardio ROS  (-) dysrhythmias  Rhythm:regular Rate:Normal     Neuro/Psych PSYCHIATRIC DISORDERS Depression negative neurological ROS     GI/Hepatic negative GI ROS, Neg liver ROS,   Endo/Other  negative endocrine ROS  Renal/GU negative Renal ROS  negative genitourinary   Musculoskeletal   Abdominal   Peds  Hematology negative hematology ROS (+)   Anesthesia Other Findings obese  Past Medical History: 2014: Abdominal pain No date: Depression 2014: Gallstones No date: History of kidney stones  Past Surgical History: No date: CHOLECYSTECTOMY No date: TUBAL LIGATION  BMI    Body Mass Index: 34.68 kg/m      Reproductive/Obstetrics negative OB ROS                           Anesthesia Physical Anesthesia Plan  ASA: II  Anesthesia Plan: General ETT   Post-op Pain Management:    Induction:   PONV Risk Score and Plan: Midazolam, Ondansetron, Dexamethasone and Propofol infusion  Airway Management Planned:   Additional Equipment:   Intra-op Plan:   Post-operative Plan:   Informed Consent: I have reviewed the patients History and Physical, chart, labs and discussed the procedure including the risks, benefits and alternatives for the proposed anesthesia with the patient or authorized representative who has indicated his/her understanding and  acceptance.     Dental Advisory Given  Plan Discussed with: Anesthesiologist, CRNA and Surgeon  Anesthesia Plan Comments:       Anesthesia Quick Evaluation

## 2020-06-28 NOTE — Transfer of Care (Signed)
Immediate Anesthesia Transfer of Care Note  Patient: Shelly Williams  Procedure(s) Performed: ANTERIOR REPAIR (CYSTOCELE) (N/A ) PUBOVAGINAL SLING  PROCEDURE - TVT EXACT (N/A ) CYSTOSCOPY  Patient Location: PACU  Anesthesia Type:General  Level of Consciousness: drowsy  Airway & Oxygen Therapy: Patient Spontanous Breathing and Patient connected to face mask oxygen  Post-op Assessment: Report given to RN and Post -op Vital signs reviewed and stable  Post vital signs: Reviewed and stable  Last Vitals:  Vitals Value Taken Time  BP 94/46 06/28/20 1154  Temp    Pulse 86 06/28/20 1200  Resp 38 06/28/20 1200  SpO2 99 % 06/28/20 1200  Vitals shown include unvalidated device data.  Last Pain:  Vitals:   06/28/20 1154  TempSrc:   PainSc: (P) 0-No pain         Complications: No complications documented.

## 2020-06-30 NOTE — Anesthesia Postprocedure Evaluation (Signed)
Anesthesia Post Note  Patient: Shelly Williams  Procedure(s) Performed: ANTERIOR REPAIR (CYSTOCELE) (N/A ) PUBOVAGINAL SLING  PROCEDURE - TVT EXACT (N/A ) CYSTOSCOPY  Patient location during evaluation: PACU Anesthesia Type: General Level of consciousness: awake and alert Pain management: pain level controlled Vital Signs Assessment: post-procedure vital signs reviewed and stable Respiratory status: spontaneous breathing, nonlabored ventilation and respiratory function stable Cardiovascular status: blood pressure returned to baseline and stable Postop Assessment: no apparent nausea or vomiting Anesthetic complications: no   No complications documented.   Last Vitals:  Vitals:   06/28/20 1325 06/28/20 1400  BP: (!) 101/47 107/63  Pulse: 64   Resp: 16 16  Temp: 36.5 C   SpO2: 100% 100%    Last Pain:  Vitals:   06/28/20 1400  TempSrc:   PainSc: 0-No pain                 Brett Canales Kenedie Dirocco

## 2020-07-05 ENCOUNTER — Other Ambulatory Visit: Payer: Self-pay

## 2020-07-05 ENCOUNTER — Encounter: Payer: Self-pay | Admitting: Obstetrics & Gynecology

## 2020-07-05 ENCOUNTER — Ambulatory Visit (INDEPENDENT_AMBULATORY_CARE_PROVIDER_SITE_OTHER): Payer: Medicaid Other | Admitting: Obstetrics & Gynecology

## 2020-07-05 VITALS — BP 120/80 | Ht 62.0 in | Wt 192.0 lb

## 2020-07-05 DIAGNOSIS — N8111 Cystocele, midline: Secondary | ICD-10-CM

## 2020-07-05 DIAGNOSIS — N393 Stress incontinence (female) (male): Secondary | ICD-10-CM

## 2020-07-05 NOTE — Progress Notes (Signed)
  Postoperative Follow-up Patient presents post op from anterior colporrhaphy and TVT sling for urinary incontinence, 1 week ago.  Subjective: Patient reports marked improvement in her preop symptoms. Eating a regular diet without difficulty. The patient is not having any pain.  Activity: normal activities of daily living. Patient reports additional symptom's since surgery of no bleeding (other than period this week) and no urinary complaints  Objective: BP 120/80   Ht 5\' 2"  (1.575 m)   Wt 192 lb (87.1 kg)   LMP 06/09/2020   BMI 35.12 kg/m  Physical Exam Constitutional:      General: She is not in acute distress.    Appearance: She is well-developed.  Cardiovascular:     Rate and Rhythm: Normal rate.  Pulmonary:     Effort: Pulmonary effort is normal.  Abdominal:     General: There is no distension.     Palpations: Abdomen is soft.     Tenderness: There is no abdominal tenderness.     Comments: Incision Healing Well   Musculoskeletal:        General: Normal range of motion.  Neurological:     Mental Status: She is alert and oriented to person, place, and time.     Cranial Nerves: No cranial nerve deficit.  Skin:    General: Skin is warm and dry.     Assessment: s/p :  anterior colporrhaphy and TVT Sling stable  Plan: Patient has done well after surgery with no apparent complications.  I have discussed the post-operative course to date, and the expected progress moving forward.  The patient understands what complications to be concerned about.  I will see the patient in routine follow up, or sooner if needed.    Activity plan: No heavy lifting.  Pelvic rest.  Hoyt Koch 07/05/2020, 11:23 AM

## 2020-08-10 ENCOUNTER — Other Ambulatory Visit: Payer: Self-pay

## 2020-08-10 ENCOUNTER — Encounter: Payer: Self-pay | Admitting: Obstetrics & Gynecology

## 2020-08-10 ENCOUNTER — Ambulatory Visit (INDEPENDENT_AMBULATORY_CARE_PROVIDER_SITE_OTHER): Payer: Medicaid Other | Admitting: Obstetrics & Gynecology

## 2020-08-10 VITALS — BP 100/70 | Ht 62.0 in | Wt 186.0 lb

## 2020-08-10 DIAGNOSIS — Z9889 Other specified postprocedural states: Secondary | ICD-10-CM

## 2020-08-10 NOTE — Progress Notes (Signed)
  Postoperative Follow-up Patient presents post op from Sling and Avella for urinary incontinence, 6 weeks ago.  Subjective: Patient reports marked improvement in her preop symptoms. Eating a regular diet without difficulty. The patient is not having any pain.  Activity: normal activities of daily living. Patient reports additional symptom's since surgery of no incontinence.  No bleeding.  Objective: BP 100/70   Ht 5\' 2"  (1.575 m)   Wt 186 lb (84.4 kg)   BMI 34.02 kg/m  Physical Exam Constitutional:      General: She is not in acute distress.    Appearance: She is well-developed.  Genitourinary:     Pelvic exam was performed with patient supine.     Vagina and rectum normal.     No vaginal erythema or bleeding.     No right or left adnexal mass present.     Right adnexa not tender.     Left adnexa not tender.     Genitourinary Comments: Cervix and uterus absent. Vaginal cuff healing well.  Cardiovascular:     Rate and Rhythm: Normal rate.  Pulmonary:     Effort: Pulmonary effort is normal.  Abdominal:     General: There is no distension.     Palpations: Abdomen is soft.     Tenderness: There is no abdominal tenderness.     Comments: Incision healing well.  Musculoskeletal:        General: Normal range of motion.  Neurological:     Mental Status: She is alert and oriented to person, place, and time.     Cranial Nerves: No cranial nerve deficit.  Skin:    General: Skin is warm and dry.     Assessment: s/p :  anterior colporrhaphy and sling for incontiencne progressing well  Plan: Patient has done well after surgery with no apparent complications.  I have discussed the post-operative course to date, and the expected progress moving forward.  The patient understands what complications to be concerned about.  I will see the patient in routine follow up, or sooner if needed.    Activity plan: No restriction.  Hoyt Koch 08/10/2020, 9:29 AM

## 2020-08-17 ENCOUNTER — Ambulatory Visit: Payer: Self-pay | Admitting: Family Medicine

## 2020-08-18 ENCOUNTER — Other Ambulatory Visit: Payer: Self-pay

## 2020-08-18 ENCOUNTER — Ambulatory Visit (INDEPENDENT_AMBULATORY_CARE_PROVIDER_SITE_OTHER): Payer: Medicaid Other | Admitting: Family Medicine

## 2020-08-18 ENCOUNTER — Encounter: Payer: Self-pay | Admitting: Family Medicine

## 2020-08-18 VITALS — BP 99/64 | HR 72 | Temp 98.6°F | Resp 16 | Ht 62.0 in | Wt 183.6 lb

## 2020-08-18 DIAGNOSIS — E669 Obesity, unspecified: Secondary | ICD-10-CM | POA: Diagnosis not present

## 2020-08-18 DIAGNOSIS — Z23 Encounter for immunization: Secondary | ICD-10-CM

## 2020-08-18 DIAGNOSIS — R634 Abnormal weight loss: Secondary | ICD-10-CM | POA: Diagnosis not present

## 2020-08-18 DIAGNOSIS — Z6833 Body mass index (BMI) 33.0-33.9, adult: Secondary | ICD-10-CM

## 2020-08-18 NOTE — Progress Notes (Signed)
Established patient visit   Patient: Shelly Williams   DOB: October 09, 1980   39 y.o. Female  MRN: [id number] Visit Date: 08/18/2020  Today's healthcare provider: Lavon Paganini, MD   Chief Complaint  Patient presents with  . Obesity   Subjective    HPI   Shelly Williams is doing 'well'. She has lost 40lbs since starting her topomax but is interested in discontinuing her medications due to the side effects. She has felt that her mood has become more anxious in the last month, she feels jittery and gets compulsions where she has a desire to eat, but does not feel hungry. Additionally, she has noted difficulty sleeping and will often awake after 4 hours of sleep and find it difficult to fall back asleep,  Her diet is described as an 8/10. She has removed sugar from her diet and enjoys a good variety, she does state that she could eat more vegetables. She exercises daily and walks 1-2 hours in the morning and participates in personal training classes 3x a week.   Social History   Tobacco Use  . Smoking status: Never Smoker  . Smokeless tobacco: Never Used  Vaping Use  . Vaping Use: Never used  Substance Use Topics  . Alcohol use: No  . Drug use: No       Medications: Outpatient Medications Prior to Visit  Medication Sig  . [DISCONTINUED] phentermine 15 MG capsule Take 15 mg by mouth every morning.  . [DISCONTINUED] topiramate (TOPAMAX) 50 MG tablet Take 50 mg by mouth 2 (two) times daily.   No facility-administered medications prior to visit.    Review of Systems  Constitutional: Positive for appetite change.  Eyes: Negative.   Respiratory: Negative.   Cardiovascular: Negative.   Gastrointestinal: Negative.   Endocrine: Negative.   Genitourinary: Negative.   Musculoskeletal: Negative.   Skin: Negative.   Allergic/Immunologic: Negative.   Neurological: Negative.   Hematological: Negative.   Psychiatric/Behavioral: The patient is nervous/anxious.       Objective      BP 99/64 (BP Location: Left Arm, Patient Position: Sitting, Cuff Size: Large)   Pulse 72   Temp 98.6 F (37 C) (Oral)   Resp 16   Ht 5\' 2"  (1.575 m)   Wt 183 lb 9.6 oz (83.3 kg)   LMP 08/11/2020 (Exact Date)   SpO2 99%   BMI 33.58 kg/m  BP Readings from Last 3 Encounters:  08/18/20 99/64  08/10/20 100/70  07/05/20 120/80   Wt Readings from Last 3 Encounters:  08/18/20 183 lb 9.6 oz (83.3 kg)  08/10/20 186 lb (84.4 kg)  07/05/20 192 lb (87.1 kg)      Physical Exam   General: Well appearing, conversational, pleasant affect Lungs: CTA bilaterally Heart: RRR, no murmurs rubs or gallops  No results found for any visits on 08/18/20.  Assessment & Plan     Problem List Items Addressed This Visit      Other   Class 1 obesity without serious comorbidity with body mass index (BMI) of 33.0 to 33.9 in adult - Primary    Discussed importance of healthy weight management Discussed diet and exercise  Congratulated on weight loss      Weight loss    Lost 40 lbs in the last 3 months Discontinue Topamax and fentermine due to side effects of sleeplessness, and anxious symptoms Discussed potential abdominal plasty procedure, patient said she is thinking about it but does not feel ready Continue diet and exercise  Other Visit Diagnoses    Need for influenza vaccination       Relevant Orders   Flu Vaccine QUAD 36+ mos IM (Completed)       Return in about 5 months (around 01/16/2021) for CPE, as scheduled.     Note was initiated by Stark Klein, MS3  Patient seen along with MS3 student [facility] I personally evaluated this patient along with the student, and verified all aspects of the history, physical exam, and medical decision making as documented by the student. I agree with the student's documentation and have made all necessary edits.  Cainen Burnham, Dionne Bucy, MD, MPH [facility]

## 2020-08-18 NOTE — Assessment & Plan Note (Addendum)
Lost 40 lbs in the last 3 months Discontinue Topamax and fentermine due to side effects of sleeplessness, and anxious symptoms Discussed potential abdominal plasty procedure, patient said she is thinking about it but does not feel ready Continue diet and exercise

## 2020-08-18 NOTE — Assessment & Plan Note (Signed)
Discussed importance of healthy weight management ?Discussed diet and exercise  ?Congratulated on weight loss ?

## 2020-08-30 ENCOUNTER — Ambulatory Visit (INDEPENDENT_AMBULATORY_CARE_PROVIDER_SITE_OTHER): Payer: Medicaid Other | Admitting: Adult Health

## 2020-08-30 ENCOUNTER — Encounter: Payer: Self-pay | Admitting: Adult Health

## 2020-08-30 DIAGNOSIS — H9202 Otalgia, left ear: Secondary | ICD-10-CM | POA: Diagnosis not present

## 2020-08-30 DIAGNOSIS — R059 Cough, unspecified: Secondary | ICD-10-CM | POA: Diagnosis not present

## 2020-08-30 DIAGNOSIS — J40 Bronchitis, not specified as acute or chronic: Secondary | ICD-10-CM

## 2020-08-30 DIAGNOSIS — J0140 Acute pansinusitis, unspecified: Secondary | ICD-10-CM | POA: Diagnosis not present

## 2020-08-30 MED ORDER — AMOXICILLIN-POT CLAVULANATE 875-125 MG PO TABS: 1.0000 | ORAL_TABLET | Freq: Two times a day (BID) | ORAL | 0 refills | Status: DC

## 2020-08-30 MED ORDER — BENZONATATE 100 MG PO CAPS: 100.0000 mg | ORAL_CAPSULE | Freq: Three times a day (TID) | ORAL | 0 refills | Status: DC | PRN

## 2020-08-30 MED ORDER — PREDNISONE 10 MG (21) PO TBPK: ORAL_TABLET | ORAL | 0 refills | Status: DC

## 2020-08-30 NOTE — Progress Notes (Signed)
MyChart Video Visit    Virtual Visit via Video Note   This visit type was conducted due to national recommendations for restrictions regarding the COVID-19 Pandemic (e.g. social distancing) in an effort to limit this patient's exposure and mitigate transmission in our community. This patient is at least at moderate risk for complications without adequate follow up. This format is felt to be most appropriate for this patient at this time. Physical exam was limited by quality of the video and audio technology used for the visit.  Parties involved in visit are below:  Patient location: at home  Provider location: Provider: Provider's office at  [facility], [place].     I discussed the limitations of evaluation and management by telemedicine and the availability of in person appointments. The patient expressed understanding and agreed to proceed.  Patient: Shelly Williams   DOB: [birth year]   39 y.o. Female  MRN: [id number] Visit Date: 08/30/2020  Today's healthcare provider: Marcille Buffy, FNP   Chief Complaint  Patient presents with  . URI   Subjective    HPI HPI    URI    Associated symptoms inlclude cough, congestion, chills, achiness, headache, ear pain, sore throat, plugged ear sensation, rhinorrhea and swollen glands.  Recent episode started in the past 12 days and she reports symptoms are getting worse. Left ear is painful inside. No pain with tragal pull.   The problem has been unchanged since onset.  The temperature has been with in normal range.   dPatient  is drinking plenty of fluids.  Patient is not a smoker.       She has chills that started this morning. She denies any distress. Denies any known exposures.    Comments    Patient reports taking OTC medications, reports no symptom control. Patient reports cough is worse at night. Patient denies any sick contacts.        Last edited by Dorian Pod, CMA on 08/30/2020 10:38 AM.  (History)    Pain in left ear is moderate. Increased mucous in throat.  Deniers any drainage in ears.  Patient  denies any fever,  rash, chest pain, shortness of breath, nausea, vomiting, or diarrhea.  Denies dizziness, lightheadedness, pre syncopal or syncopal episodes.   She denies any covid exposure, or flu or mono, strep.  Denies any loss or taste or smell.    Patient's last menstrual period was 08/11/2020 (exact date).  Patient Active Problem List   Diagnosis Date Noted  . Left ear pain 08/30/2020  . Cough 08/30/2020  . Bronchitis 08/30/2020  . Acute non-recurrent pansinusitis 08/30/2020  . Weight loss 08/18/2020  . Cystocele, midline 02/15/2020  . Urinary, incontinence, stress female 01/19/2020  . Class 1 obesity without serious comorbidity with body mass index (BMI) of 33.0 to 33.9 in adult 08/31/2019  . Nausea 08/31/2019  . Multiple falls 08/31/2019  . Transient neurological symptoms 08/31/2019  . Chronic pain of left knee 08/31/2019   Social History   Tobacco Use  . Smoking status: Never Smoker  . Smokeless tobacco: Never Used  Vaping Use  . Vaping Use: Never used  Substance Use Topics  . Alcohol use: No  . Drug use: No   No Known Allergies    Medications: No outpatient medications prior to visit.   No facility-administered medications prior to visit.    Review of Systems  Constitutional: Positive for chills. Negative for fatigue.  HENT: Positive for congestion, ear pain, postnasal drip,  rhinorrhea and sore throat.   Respiratory: Positive for cough. Negative for shortness of breath and wheezing.   Cardiovascular: Negative for chest pain and palpitations.  Musculoskeletal: Positive for myalgias.  Neurological: Positive for headaches.    Last CBC Lab Results  Component Value Date   WBC 7.3 06/24/2020   HGB 11.7 (L) 06/24/2020   HCT 34.6 (L) 06/24/2020   MCV 81.6 06/24/2020   MCH 27.6 06/24/2020   RDW 15.1 06/24/2020   PLT 126 (L) 06/24/2020       Objective    LMP 08/11/2020 (Exact Date)    Physical Exam    Patient is alert and oriented and responsive to questions Engages in conversation with provider. Speaks in full sentences without any pauses without any shortness of breath or distress.     Assessment & Plan     Acute non-recurrent pansinusitis - Plan: amoxicillin-clavulanate (AUGMENTIN) 875-125 MG tablet  Bronchitis - Plan: benzonatate (TESSALON) 100 MG capsule, predniSONE (STERAPRED UNI-PAK 21 TAB) 10 MG (21) TBPK tablet  Cough - Plan: benzonatate (TESSALON) 100 MG capsule, predniSONE (STERAPRED UNI-PAK 21 TAB) 10 MG (21) TBPK tablet, COVID-19, Flu A+B and RSV  Left ear pain - Plan: amoxicillin-clavulanate (AUGMENTIN) 875-125 MG tablet   Over 12 days of symptoms, worsening. Will treat as possible bacterial at this point.  Will still have her test for Covid this afternoon in parking lot, instructions given.   Meds ordered this encounter  Medications  . benzonatate (TESSALON) 100 MG capsule    Sig: Take 1 capsule (100 mg total) by mouth 3 (three) times daily as needed for cough.    Dispense:  20 capsule    Refill:  0  . predniSONE (STERAPRED UNI-PAK 21 TAB) 10 MG (21) TBPK tablet    Sig: PO: Take 6 tablets on day 1:Take 5 tablets day 2:Take 4 tablets day 3: Take 3 tablets day 4:Take 2 tablets day five: 5 Take 1 tablet day 6    Dispense:  21 tablet    Refill:  0  . amoxicillin-clavulanate (AUGMENTIN) 875-125 MG tablet    Sig: Take 1 tablet by mouth 2 (two) times daily.    Dispense:  20 tablet    Refill:  0     Return in about 1 week (around 09/06/2020), or if symptoms worsen or fail to improve, for at any time for any worsening symptoms, Go to Emergency room/ urgent care if worse.     I discussed the assessment and treatment plan with the patient. The patient was provided an opportunity to ask questions and all were answered. The patient agreed with the plan and demonstrated an understanding of the  instructions.   The patient was advised to call back or seek an in-person evaluation if the symptoms worsen or if the condition fails to improve as anticipated.  I provided 25  minutes of non-face-to-face time during this encounter. I discussed the limitations of evaluation and management by telemedicine and the availability of in person appointments. The patient expressed understanding and agreed to proceed.  Marcille Buffy, [place] [phone] (phone) [phone] (fax)  [place]

## 2020-08-30 NOTE — Patient Instructions (Signed)
Sinusitis, Adult Sinusitis is soreness and swelling (inflammation) of your sinuses. Sinuses are hollow spaces in the bones around your face. They are located:  Around your eyes.  In the middle of your forehead.  Behind your nose.  In your cheekbones. Your sinuses and nasal passages are lined with a fluid called mucus. Mucus drains out of your sinuses. Swelling can trap mucus in your sinuses. This lets germs (bacteria, virus, or fungus) grow, which leads to infection. Most of the time, this condition is caused by a virus. What are the causes? This condition is caused by:  Allergies.  Asthma.  Germs.  Things that block your nose or sinuses.  Growths in the nose (nasal polyps).  Chemicals or irritants in the air.  Fungus (rare). What increases the risk? You are more likely to develop this condition if:  You have a weak body defense system (immune system).  You do a lot of swimming or diving.  You use nasal sprays too much.  You smoke. What are the signs or symptoms? The main symptoms of this condition are pain and a feeling of pressure around the sinuses. Other symptoms include:  Stuffy nose (congestion).  Runny nose (drainage).  Swelling and warmth in the sinuses.  Headache.  Toothache.  A cough that may get worse at night.  Mucus that collects in the throat or the back of the nose (postnasal drip).  Being unable to smell and taste.  Being very tired (fatigue).  A fever.  Sore throat.  Bad breath. How is this diagnosed? This condition is diagnosed based on:  Your symptoms.  Your medical history.  A physical exam.  Tests to find out if your condition is short-term (acute) or long-term (chronic). Your doctor may: ? Check your nose for growths (polyps). ? Check your sinuses using a tool that has a light (endoscope). ? Check for allergies or germs. ? Do imaging tests, such as an MRI or CT scan. How is this treated? Treatment for this condition  depends on the cause and whether it is short-term or long-term.  If caused by a virus, your symptoms should go away on their own within 10 days. You may be given medicines to relieve symptoms. They include: ? Medicines that shrink swollen tissue in the nose. ? Medicines that treat allergies (antihistamines). ? A spray that treats swelling of the nostrils. ? Rinses that help get rid of thick mucus in your nose (nasal saline washes).  If caused by bacteria, your doctor may wait to see if you will get better without treatment. You may be given antibiotic medicine if you have: ? A very bad infection. ? A weak body defense system.  If caused by growths in the nose, you may need to have surgery. Follow these instructions at home: Medicines  Take, use, or apply over-the-counter and prescription medicines only as told by your doctor. These may include nasal sprays.  If you were prescribed an antibiotic medicine, take it as told by your doctor. Do not stop taking the antibiotic even if you start to feel better. Hydrate and humidify   Drink enough water to keep your pee (urine) pale yellow.  Use a cool mist humidifier to keep the humidity level in your home above 50%.  Breathe in steam for 10-15 minutes, 3-4 times a day, or as told by your doctor. You can do this in the bathroom while a hot shower is running.  Try not to spend time in cool or dry air.   Rest  Rest as much as you can.  Sleep with your head raised (elevated).  Make sure you get enough sleep each night. General instructions   Put a warm, moist washcloth on your face 3-4 times a day, or as often as told by your doctor. This will help with discomfort.  Wash your hands often with soap and water. If there is no soap and water, use hand sanitizer.  Do not smoke. Avoid being around people who are smoking (secondhand smoke).  Keep all follow-up visits as told by your doctor. This is important. Contact a doctor if:  You  have a fever.  Your symptoms get worse.  Your symptoms do not get better within 10 days. Get help right away if:  You have a very bad headache.  You cannot stop throwing up (vomiting).  You have very bad pain or swelling around your face or eyes.  You have trouble seeing.  You feel confused.  Your neck is stiff.  You have trouble breathing. Summary  Sinusitis is swelling of your sinuses. Sinuses are hollow spaces in the bones around your face.  This condition is caused by tissues in your nose that become inflamed or swollen. This traps germs. These can lead to infection.  If you were prescribed an antibiotic medicine, take it as told by your doctor. Do not stop taking it even if you start to feel better.  Keep all follow-up visits as told by your doctor. This is important. This information is not intended to replace advice given to you by your health care provider. Make sure you discuss any questions you have with your health care provider. Document Revised: 02/24/2018 Document Reviewed: 02/24/2018 Elsevier Patient Education  [place]. Acute Bronchitis, Adult  Acute bronchitis is when air tubes in the lungs (bronchi) suddenly get swollen. The condition can make it hard for you to breathe. In adults, acute bronchitis usually goes away within 2 weeks. A cough caused by bronchitis may last up to 3 weeks. Smoking, allergies, and asthma can make the condition worse. What are the causes? This condition is caused by:  Cold and flu viruses. The most common cause of this condition is the virus that causes the common cold.  Bacteria.  Substances that irritate the lungs, including: ? Smoke from cigarettes and other types of tobacco. ? Dust and pollen. ? Fumes from chemicals, gases, or burned fuel. ? Other materials that pollute indoor or outdoor air.  Close contact with someone who has acute bronchitis. What increases the risk? The following factors may make you more  likely to develop this condition:  A weak body's defense system. This is also called the immune system.  Any condition that affects your lungs and breathing, such as asthma. What are the signs or symptoms? Symptoms of this condition include:  A cough.  Coughing up clear, yellow, or green mucus.  Wheezing.  Chest congestion.  Shortness of breath.  A fever.  Body aches.  Chills.  A sore throat. How is this treated? Acute bronchitis may go away over time without treatment. Your doctor may recommend:  Drinking more fluids.  Taking a medicine for a fever or cough.  Using a device that gets medicine into your lungs (inhaler).  Using a vaporizer or a humidifier. These are machines that add water or moisture in the air to help with coughing and poor breathing. Follow these instructions at home:  Activity  Get a lot of rest.  Avoid places where there  are fumes from chemicals.  Return to your normal activities as told by your doctor. Ask your doctor what activities are safe for you. Lifestyle  Drink enough fluids to keep your pee (urine) pale yellow.  Do not drink alcohol.  Do not use any products that contain nicotine or tobacco, such as cigarettes, e-cigarettes, and chewing tobacco. If you need help quitting, ask your doctor. Be aware that: ? Your bronchitis will get worse if you smoke or breathe in other people's smoke (secondhand smoke). ? Your lungs will heal faster if you quit smoking. General instructions  Take over-the-counter and prescription medicines only as told by your doctor.  Use an inhaler, cool mist vaporizer, or humidifier as told by your doctor.  Rinse your mouth often with salt water. To make salt water, dissolve -1 tsp (3-6 g) of salt in 1 cup (237 mL) of warm water.  Keep all follow-up visits as told by your doctor. This is important. How is this prevented? To lower your risk of getting this condition again:  Wash your hands often with  soap and water. If soap and water are not available, use hand sanitizer.  Avoid contact with people who have cold symptoms.  Try not to touch your mouth, nose, or eyes with your hands.  Make sure to get the flu shot every year. Contact a doctor if:  Your symptoms do not get better in 2 weeks.  You vomit more than once or twice.  You have symptoms of loss of fluid from your body (dehydration). These include: ? Dark urine. ? Dry skin or eyes. ? Increased thirst. ? Headaches. ? Confusion. ? Muscle cramps. Get help right away if:  You cough up blood.  You have chest pain.  You have very bad shortness of breath.  You become dehydrated.  You faint or keep feeling like you are going to faint.  You keep vomiting.  You have a very bad headache.  Your fever or chills get worse. These symptoms may be an emergency. Do not wait to see if the symptoms will go away. Get medical help right away. Call your local emergency services (911 in the [place] Do not drive yourself to the hospital. Summary  Acute bronchitis is when air tubes in the lungs (bronchi) suddenly get swollen. In adults, acute bronchitis usually goes away within 2 weeks.  Take over-the-counter and prescription medicines only as told by your doctor.  Drink enough fluid to keep your pee (urine) pale yellow.  Contact a doctor if your symptoms do not improve after 2 weeks of treatment.  Get help right away if you cough up blood, faint, or have chest pain or shortness of breath. This information is not intended to replace advice given to you by your health care provider. Make sure you discuss any questions you have with your health care provider. Document Revised: 04/17/2019 Document Reviewed: 04/17/2019 Elsevier Patient Education  [place]. Otitis Media, Adult  Otitis media means that the middle ear is red and swollen (inflamed) and full of fluid. The condition usually goes away on its own. Follow these  instructions at home:  Take over-the-counter and prescription medicines only as told by your doctor.  If you were prescribed an antibiotic medicine, take it as told by your doctor. Do not stop taking the antibiotic even if you start to feel better.  Keep all follow-up visits as told by your doctor. This is important. Contact a doctor if:  You have bleeding from your  nose.  There is a lump on your neck.  You are not getting better in 5 days.  You feel worse instead of better. Get help right away if:  You have pain that is not helped with medicine.  You have swelling, redness, or pain around your ear.  You get a stiff neck.  You cannot move part of your face (paralyzed).  You notice that the bone behind your ear hurts when you touch it.  You get a very bad headache. Summary  Otitis media means that the middle ear is red, swollen, and full of fluid.  This condition usually goes away on its own. In some cases, treatment may be needed.  If you were prescribed an antibiotic medicine, take it as told by your doctor. This information is not intended to replace advice given to you by your health care provider. Make sure you discuss any questions you have with your health care provider. Document Revised: 09/06/2017 Document Reviewed: 10/15/2016 Elsevier Patient Education  2020 [employer].

## 2020-08-31 LAB — COVID-19, FLU A+B AND RSV
Influenza A, NAA: NOT DETECTED
Influenza B, NAA: NOT DETECTED
RSV, NAA: NOT DETECTED
SARS-CoV-2, NAA: NOT DETECTED

## 2020-09-05 ENCOUNTER — Encounter: Payer: Self-pay | Admitting: Adult Health

## 2020-09-05 NOTE — Progress Notes (Signed)
Negative flu, covid and RSV.

## 2020-09-13 ENCOUNTER — Ambulatory Visit: Payer: Self-pay

## 2020-09-13 NOTE — Telephone Encounter (Signed)
Patient daughter called with her mother present.  She states that 30 minutes ago her mother started to have severe left arm pain She states that it radiates to shoulder and into her back.  She denies injury. Patient sounds in distress from pain but denies SOB. Daugher will call 911 and have her mother transported to hospital. Triage was brief because of symptoms. Reason for Disposition . [1] Chest pain lasts > 5 minutes AND [2] age > 52 AND [3] one or more cardiac risk factors (e.g., diabetes, high blood pressure, high cholesterol, smoker, or strong family history of heart disease)  Answer Assessment - Initial Assessment Questions 1. LOCATION: "Where does it hurt?"       Left arm into my shoulder 2. RADIATION: "Does the pain go anywhere else?" (e.g., into neck, jaw, arms, back)    backshoulder left 3. ONSET: "When did the chest pain begin?" (Minutes, hours or days)      30 minutes go 4. PATTERN "Does the pain come and go, or has it been constant since it started?"  "Does it get worse with exertion?"       5. DURATION: "How long does it last" (e.g., seconds, minutes, hours)   30 minutes 6. SEVERITY: "How bad is the pain?"  (e.g., Scale 1-10; mild, moderate, or severe)    - MILD (1-3): doesn't interfere with normal activities     - MODERATE (4-7): interferes with normal activities or awakens from sleep    - SEVERE (8-10): excruciating pain, unable to do any normal activities       severe 7. CARDIAC RISK FACTORS: "Do you have any history of heart problems or risk factors for heart disease?" (e.g., angina, prior heart attack; diabetes, high blood pressure, high cholesterol, smoker, or strong family history of heart disease)     Per daughter none 8. PULMONARY RISK FACTORS: "Do you have any history of lung disease?"  (e.g., blood clots in lung, asthma, emphysema, birth control pills)    Not asked 9. CAUSE: "What do you think is causing the chest pain?"     Unsure denies injury 10. OTHER SYMPTOMS:  "Do you have any other symptoms?" (e.g., dizziness, nausea, vomiting, sweating, fever, difficulty breathing, cough)      deniessdifficulty breathing 11. PREGNANCY: "Is there any chance you are pregnant?" "When was your last menstrual period?"       Not asked  Protocols used: CHEST PAIN-A-AH

## 2020-11-02 NOTE — Progress Notes (Signed)
Virginia Crews, MD   Chief Complaint  Patient presents with  . Vaginitis    HPI:      [name] is a 40 y.o. [phone] whose LMP was Patient's last menstrual period was 10/09/2020., presents today for vaginal itching/burning/dryness for the past wk. No increased d/c or odor. Having pain vaginally with sex. No recent abx use, no meds to treat.  Neg pap/STD testing 4/21  Past Medical History:  Diagnosis Date  . Abdominal pain 2014  . Depression   . Gallstones 2014  . History of kidney stones     Past Surgical History:  Procedure Laterality Date  . BLADDER SUSPENSION N/A 06/28/2020   Procedure: PUBOVAGINAL SLING  PROCEDURE - TVT EXACT;  Surgeon: Gae Dry, MD;  Location: [facility] ORS;  Service: Gynecology;  Laterality: N/A;  . CHOLECYSTECTOMY    . CYSTOCELE REPAIR N/A 06/28/2020   Procedure: ANTERIOR REPAIR (CYSTOCELE);  Surgeon: Gae Dry, MD;  Location: [facility] ORS;  Service: Gynecology;  Laterality: N/A;  . CYSTOSCOPY  06/28/2020   Procedure: CYSTOSCOPY;  Surgeon: Gae Dry, MD;  Location: [facility] ORS;  Service: Gynecology;;  . TUBAL LIGATION      Family History  Problem Relation Age of Onset  . Cancer Maternal Aunt        lung  . Cancer Other        lung - maternal great aunt  . Cancer Maternal Aunt        breast  . Healthy Mother   . Healthy Father   . Healthy Sister   . Healthy Brother   . Healthy Daughter   . Healthy Son   . Healthy Daughter   . Healthy Daughter     Social History   Socioeconomic History  . Marital status: Married    Spouse name: Not on file  . Number of children: 4  . Years of education: Not on file  . Highest education level: Not on file  Occupational History  . Not on file  Tobacco Use  . Smoking status: Never Smoker  . Smokeless tobacco: Never Used  Vaping Use  . Vaping Use: Never used  Substance and Sexual Activity  . Alcohol use: No  . Drug use: No  . Sexual activity: Yes    Partners: Male     Birth control/protection: Surgical  Other Topics Concern  . Not on file  Social History Narrative  . Not on file   Social Determinants of Health   Financial Resource Strain: Not on file  Food Insecurity: Not on file  Transportation Needs: Not on file  Physical Activity: Not on file  Stress: Not on file  Social Connections: Not on file  Intimate Partner Violence: Not on file    Outpatient Medications Prior to Visit  Medication Sig Dispense Refill  . amoxicillin-clavulanate (AUGMENTIN) 875-125 MG tablet Take 1 tablet by mouth 2 (two) times daily. (Patient not taking: Reported on 11/03/2020) 20 tablet 0  . benzonatate (TESSALON) 100 MG capsule Take 1 capsule (100 mg total) by mouth 3 (three) times daily as needed for cough. (Patient not taking: Reported on 11/03/2020) 20 capsule 0  . predniSONE (STERAPRED UNI-PAK 21 TAB) 10 MG (21) TBPK tablet PO: Take 6 tablets on day 1:Take 5 tablets day 2:Take 4 tablets day 3: Take 3 tablets day 4:Take 2 tablets day five: 5 Take 1 tablet day 6 (Patient not taking: Reported on 11/03/2020) 21 tablet 0   No facility-administered medications  prior to visit.      ROS:  Review of Systems  Constitutional: Negative for fever, malaise/fatigue and weight loss.  Gastrointestinal: Negative for blood in stool, constipation, diarrhea, nausea and vomiting.  Genitourinary: Positive for dyspareunia and vaginal pain. Negative for dysuria, flank pain, frequency, hematuria, urgency, vaginal bleeding and vaginal discharge.  Musculoskeletal: Negative for back pain.  Skin: Negative for itching and rash.   BREAST: No symptoms   OBJECTIVE:   Vitals:  BP 122/82   Pulse 77   Temp 98.8 F (37.1 C)   Ht 5\' 2"  (1.575 m)   Wt 179 lb (81.2 kg)   LMP 10/09/2020   SpO2 98%   BMI 32.74 kg/m   Physical Exam Vitals reviewed.  Constitutional:      Appearance: She is well-developed.  Pulmonary:     Effort: Pulmonary effort is normal.  Genitourinary:    Pubic Area:  No rash.      Labia:        Right: Tenderness present. No rash or lesion.        Left: Tenderness present. No rash or lesion.      Vagina: Vaginal discharge present. No erythema or tenderness.     Cervix: Normal.     Uterus: Normal. Not enlarged and not tender.      Adnexa: Right adnexa normal and left adnexa normal.       Right: No mass or tenderness.         Left: No mass or tenderness.       Comments: BILAT LABIA MINORA WITH ERYTHEMA, WHITE D/C Musculoskeletal:        General: Normal range of motion.     Cervical back: Normal range of motion.  Skin:    General: Skin is warm and dry.  Neurological:     General: No focal deficit present.     Mental Status: She is alert and oriented to person, place, and time.  Psychiatric:        Mood and Affect: Mood normal.        Behavior: Behavior normal.        Thought Content: Thought content normal.        Judgment: Judgment normal.     Results: Results for orders placed or performed in visit on 11/03/20 (from the past 24 hour(s))  POCT Wet Prep with KOH     Status: Abnormal   Collection Time: 11/03/20  2:35 PM  Result Value Ref Range   Trichomonas, UA Negative    Clue Cells Wet Prep HPF POC neg    Epithelial Wet Prep HPF POC     Yeast Wet Prep HPF POC pos    Bacteria Wet Prep HPF POC     RBC Wet Prep HPF POC     WBC Wet Prep HPF POC     KOH Prep POC Negative Negative     Assessment/Plan: Candidal vaginitis - Plan: fluconazole (DIFLUCAN) 150 MG tablet, POCT Wet Prep with KOH; pos sx, exam, and wet prep. Rx diflucan/cool compresses. F/u prn.    Meds ordered this encounter  Medications  . fluconazole (DIFLUCAN) 150 MG tablet    Sig: Take 1 tablet (150 mg total) by mouth once for 1 dose. May repeat in 3 days if still having symptoms    Dispense:  2 tablet    Refill:  0    Order Specific Question:   Supervising Provider    Answer:   Gae Dry [[id number]]  Return if symptoms worsen or fail to improve.  Khris Jansson B.  Candid Bovey, PA-C 11/03/2020 2:36 PM

## 2020-11-03 ENCOUNTER — Encounter: Payer: Self-pay | Admitting: Obstetrics and Gynecology

## 2020-11-03 ENCOUNTER — Other Ambulatory Visit: Payer: Self-pay

## 2020-11-03 ENCOUNTER — Ambulatory Visit (INDEPENDENT_AMBULATORY_CARE_PROVIDER_SITE_OTHER): Payer: Medicaid Other | Admitting: Obstetrics and Gynecology

## 2020-11-03 VITALS — BP 122/82 | HR 77 | Temp 98.8°F | Ht 62.0 in | Wt 179.0 lb

## 2020-11-03 DIAGNOSIS — B373 Candidiasis of vulva and vagina: Secondary | ICD-10-CM | POA: Diagnosis not present

## 2020-11-03 DIAGNOSIS — B3731 Acute candidiasis of vulva and vagina: Secondary | ICD-10-CM

## 2020-11-03 LAB — POCT WET PREP WITH KOH
Clue Cells Wet Prep HPF POC: NEGATIVE
KOH Prep POC: NEGATIVE
Trichomonas, UA: NEGATIVE
Yeast Wet Prep HPF POC: POSITIVE

## 2020-11-03 MED ORDER — FLUCONAZOLE 150 MG PO TABS: 150.0000 mg | ORAL_TABLET | Freq: Once | ORAL | 0 refills | Status: AC

## 2020-11-03 NOTE — Patient Instructions (Signed)
I value your feedback and you entrusting us with your care. If you get a [facility] patient survey, I would appreciate you taking the time to let us know about your experience today. Thank you! ? ? ?

## 2020-12-14 ENCOUNTER — Telehealth: Payer: Self-pay

## 2020-12-14 NOTE — Telephone Encounter (Signed)
Copied from [place] [phone]. Topic: Appointment Scheduling - Scheduling Inquiry for Clinic >> Dec 14, 2020 11:57 AM Erick Blinks wrote: Reason for CRM: Pt is in excruciating oral pain, she just left the dentist and they have referred her back to her PCP because her teeth are fine. They suspect that this is a nerve issue. Please advise, pt would like a call back from the clinic and an appt today. She is scheduled tomorrow but needs relief now.

## 2020-12-14 NOTE — Progress Notes (Signed)
Established patient visit   Patient: Shelly Williams   DOB: [birth year]   40 y.o. Female  MRN: [id number] Visit Date: 12/15/2020  Today's healthcare provider: Lavon Paganini, MD   Chief Complaint  Patient presents with  . Facial Pain   Subjective    HPI  Patient here today C/O left facial pain. Patient reports she has seen the dentist and he recommended to follow up with PCP. Patient reports pain x one week on and off. Patient denies any swelling, fever, cough or runny nose. Patient reports Tylenol has not helped with pain.  Prior to pain starting, was having headaches intermittently. No numbness, weakness, facial asymmetry, vision changes.  Patient Active Problem List   Diagnosis Date Noted  . Left ear pain 08/30/2020  . Cough 08/30/2020  . Bronchitis 08/30/2020  . Acute non-recurrent pansinusitis 08/30/2020  . Weight loss 08/18/2020  . Cystocele, midline 02/15/2020  . Urinary, incontinence, stress female 01/19/2020  . Class 1 obesity without serious comorbidity with body mass index (BMI) of 33.0 to 33.9 in adult 08/31/2019  . Nausea 08/31/2019  . Multiple falls 08/31/2019  . Transient neurological symptoms 08/31/2019  . Chronic pain of left knee 08/31/2019   Social History   Tobacco Use  . Smoking status: Never Smoker  . Smokeless tobacco: Never Used  Vaping Use  . Vaping Use: Never used  Substance Use Topics  . Alcohol use: No  . Drug use: No   No Known Allergies     Medications: Outpatient Medications Prior to Visit  Medication Sig  . [DISCONTINUED] amoxicillin-clavulanate (AUGMENTIN) 875-125 MG tablet Take 1 tablet by mouth 2 (two) times daily. (Patient not taking: Reported on 11/03/2020)  . [DISCONTINUED] benzonatate (TESSALON) 100 MG capsule Take 1 capsule (100 mg total) by mouth 3 (three) times daily as needed for cough. (Patient not taking: Reported on 11/03/2020)  . [DISCONTINUED] predniSONE (STERAPRED UNI-PAK 21 TAB) 10 MG (21) TBPK tablet PO: Take  6 tablets on day 1:Take 5 tablets day 2:Take 4 tablets day 3: Take 3 tablets day 4:Take 2 tablets day five: 5 Take 1 tablet day 6 (Patient not taking: Reported on 11/03/2020)   No facility-administered medications prior to visit.    Review of Systems  Constitutional: Positive for appetite change. Negative for activity change, chills and fever.  Respiratory: Negative for cough.   Cardiovascular: Negative for chest pain and palpitations.  Gastrointestinal: Negative for nausea and vomiting.  Neurological: Negative for numbness.    Last CBC Lab Results  Component Value Date   WBC 7.3 06/24/2020   HGB 11.7 (L) 06/24/2020   HCT 34.6 (L) 06/24/2020   MCV 81.6 06/24/2020   MCH 27.6 06/24/2020   RDW 15.1 06/24/2020   PLT 126 (L) 06/24/2020        Objective    BP 100/64 (BP Location: Right Arm, Patient Position: Sitting, Cuff Size: Large)   Pulse 67   Temp 98.4 F (36.9 C) (Oral)   Resp 16   Ht 5\' 2"  (1.575 m)   Wt 181 lb 14.4 oz (82.5 kg)   SpO2 98%   BMI 33.27 kg/m  BP Readings from Last 3 Encounters:  12/15/20 100/64  11/03/20 122/82  08/18/20 99/64   Wt Readings from Last 3 Encounters:  12/15/20 181 lb 14.4 oz (82.5 kg)  11/03/20 179 lb (81.2 kg)  08/18/20 183 lb 9.6 oz (83.3 kg)      Physical Exam Constitutional:      General: She is not  in acute distress.    Appearance: Normal appearance.  HENT:     Head: Normocephalic.     Right Ear: External ear normal.     Left Ear: External ear normal.     Nose: Nose normal.     Mouth/Throat:     Mouth: Mucous membranes are moist.     Pharynx: Oropharynx is clear.  Eyes:     General: No scleral icterus.    Extraocular Movements: Extraocular movements intact.     Conjunctiva/sclera: Conjunctivae normal.     Pupils: Pupils are equal, round, and reactive to light.  Cardiovascular:     Rate and Rhythm: Normal rate and regular rhythm.     Pulses: Normal pulses.     Heart sounds: Normal heart sounds. No murmur  heard.   Pulmonary:     Effort: Pulmonary effort is normal. No respiratory distress.     Breath sounds: Normal breath sounds. No wheezing.  Musculoskeletal:     Cervical back: Neck supple.  Skin:    General: Skin is warm and dry.     Findings: No erythema or rash.  Neurological:     General: No focal deficit present.     Mental Status: She is alert and oriented to person, place, and time. Mental status is at baseline.     Motor: No weakness.     Comments: No cranial nerve deficit except for decreased sensation in L V3 distribution       No results found for any visits on 12/15/20.  Assessment & Plan     1. Pain in face - new problem x1 wk in L V3 distribution with decreased sensation in that area as well - Concerning for trigeminal neuralgia - no evidence of shingles - start gabapentin - referral to neurology - return precautions discussed - Ambulatory referral to Neurology   Meds ordered this encounter  Medications  . gabapentin (NEURONTIN) 300 MG capsule    Sig: Take 1 capsule (300 mg total) by mouth 3 (three) times daily.    Dispense:  90 capsule    Refill:  3     Return if symptoms worsen or fail to improve.      I, Lavon Paganini, MD, have reviewed all documentation for this visit. The documentation on 12/15/20 for the exam, diagnosis, procedures, and orders are all accurate and complete.   Alysha Doolan, Dionne Bucy, MD, MPH [facility]

## 2020-12-15 ENCOUNTER — Other Ambulatory Visit: Payer: Self-pay

## 2020-12-15 ENCOUNTER — Encounter (HOSPITAL_COMMUNITY): Payer: Self-pay | Admitting: Emergency Medicine

## 2020-12-15 ENCOUNTER — Emergency Department (HOSPITAL_COMMUNITY)
Admission: EM | Admit: 2020-12-15 | Discharge: 2020-12-16 | Disposition: A | Discharge status: Home / Self Care | Payer: Medicaid Other | Attending: Emergency Medicine | Admitting: Emergency Medicine

## 2020-12-15 ENCOUNTER — Encounter: Payer: Self-pay | Admitting: Family Medicine

## 2020-12-15 ENCOUNTER — Ambulatory Visit (INDEPENDENT_AMBULATORY_CARE_PROVIDER_SITE_OTHER): Payer: Medicaid Other | Admitting: Family Medicine

## 2020-12-15 VITALS — BP 100/64 | HR 67 | Temp 98.4°F | Resp 16 | Ht 62.0 in | Wt 181.9 lb

## 2020-12-15 DIAGNOSIS — R519 Headache, unspecified: Secondary | ICD-10-CM

## 2020-12-15 DIAGNOSIS — G500 Trigeminal neuralgia: Secondary | ICD-10-CM | POA: Insufficient documentation

## 2020-12-15 DIAGNOSIS — K0889 Other specified disorders of teeth and supporting structures: Secondary | ICD-10-CM | POA: Diagnosis present

## 2020-12-15 MED ORDER — GABAPENTIN 300 MG PO CAPS: 300.0000 mg | ORAL_CAPSULE | Freq: Three times a day (TID) | ORAL | 3 refills | Status: DC

## 2020-12-15 MED ORDER — OXYCODONE-ACETAMINOPHEN 5-325 MG PO TABS: 1.0000 | ORAL_TABLET | ORAL | 0 refills | Status: DC | PRN

## 2020-12-15 MED ORDER — OXYCODONE HCL 5 MG PO TABS: 5.0000 mg | ORAL_TABLET | ORAL | 0 refills | Status: DC | PRN

## 2020-12-15 MED ORDER — OXYCODONE-ACETAMINOPHEN 5-325 MG PO TABS
1.0000 | ORAL_TABLET | Freq: Once | ORAL | Status: AC
  Administered 2020-12-16: 1 via ORAL
  Filled 2020-12-15: qty 1

## 2020-12-15 NOTE — Discharge Instructions (Addendum)
Please see the neurologist as recommended by your primary care provider.  Take ibuprofen and acetaminophen as needed for pain. If you take them together, you will get better pain relief than taking either one by itself. Take oxycodone only for severe pain not relieved by acetaminophen and ibuprofen.  Make sure to take the gabapentin exactly as it was prescribed.

## 2020-12-15 NOTE — ED Triage Notes (Signed)
Pt states that she has pain on the left side of her face x 1 week. Was cleared by her dentist stating it wasn't her teeth and her to see her PCP.

## 2020-12-15 NOTE — ED Provider Notes (Signed)
[facility] EMERGENCY DEPARTMENT Provider Note   CSN: [id number] Arrival date & time: 12/15/20  2155   History Chief Complaint  Patient presents with  . Dental Pain    Shelly Williams is a 40 y.o. female.  The history is provided by the patient.  Dental Pain She has history of depression and comes in complaining of pain in the left side of the face for the last week.  Pain is shooting and even absent but severe.  She has taken over-the-counter analgesics without relief.  She thought it may have been dental but she saw a dentist who told her that her teeth were fine.  She saw her primary care provider who diagnosed trigeminal neuralgia and prescribed gabapentin.  She took gabapentin, but pain is not any better.  Curiously, pain does get better when she takes sips of water.  Past Medical History:  Diagnosis Date  . Abdominal pain 2014  . Depression   . Gallstones 2014  . History of kidney stones     Patient Active Problem List   Diagnosis Date Noted  . Left ear pain 08/30/2020  . Cough 08/30/2020  . Bronchitis 08/30/2020  . Acute non-recurrent pansinusitis 08/30/2020  . Weight loss 08/18/2020  . Cystocele, midline 02/15/2020  . Urinary, incontinence, stress female 01/19/2020  . Class 1 obesity without serious comorbidity with body mass index (BMI) of 33.0 to 33.9 in adult 08/31/2019  . Nausea 08/31/2019  . Multiple falls 08/31/2019  . Transient neurological symptoms 08/31/2019  . Chronic pain of left knee 08/31/2019    Past Surgical History:  Procedure Laterality Date  . BLADDER SUSPENSION N/A 06/28/2020   Procedure: PUBOVAGINAL SLING  PROCEDURE - TVT EXACT;  Surgeon: Gae Dry, MD;  Location: [facility] ORS;  Service: Gynecology;  Laterality: N/A;  . CHOLECYSTECTOMY    . CYSTOCELE REPAIR N/A 06/28/2020   Procedure: ANTERIOR REPAIR (CYSTOCELE);  Surgeon: Gae Dry, MD;  Location: [facility] ORS;  Service: Gynecology;  Laterality: N/A;  . CYSTOSCOPY  06/28/2020    Procedure: CYSTOSCOPY;  Surgeon: Gae Dry, MD;  Location: [facility] ORS;  Service: Gynecology;;  . TUBAL LIGATION       OB History    Gravida  4   Para  3   Term      Preterm      AB  1   Living  3     SAB  1   IAB      Ectopic      Multiple      Live Births           Obstetric Comments  1st Menstrual Cycle: 15 1st Pregnancy: 20        Family History  Problem Relation Age of Onset  . Cancer Maternal Aunt        lung  . Cancer Other        lung - maternal great aunt  . Cancer Maternal Aunt        breast  . Healthy Mother   . Healthy Father   . Healthy Sister   . Healthy Brother   . Healthy Daughter   . Healthy Son   . Healthy Daughter   . Healthy Daughter     Social History   Tobacco Use  . Smoking status: Never Smoker  . Smokeless tobacco: Never Used  Vaping Use  . Vaping Use: Never used  Substance Use Topics  . Alcohol use: No  . Drug use: No  Home Medications Prior to Admission medications   Medication Sig Start Date End Date Taking? Authorizing Provider  gabapentin (NEURONTIN) 300 MG capsule Take 1 capsule (300 mg total) by mouth 3 (three) times daily. 12/15/20   Virginia Crews, MD    Allergies    Patient has no known allergies.  Review of Systems   Review of Systems  All other systems reviewed and are negative.   Physical Exam Updated Vital Signs BP 130/60   Pulse 90   Temp 98.2 F (36.8 C)   Resp 18   Ht 5\' 2"  (1.575 m)   Wt 82.5 kg   SpO2 100%   BMI 33.27 kg/m   Physical Exam Vitals and nursing note reviewed.   40 year old female, resting comfortably and in no acute distress. Vital signs are normal. Oxygen saturation is 100%, which is normal. Head is normocephalic and atraumatic. PERRLA, EOMI. Oropharynx is clear.  Dentition appears normal. Neck is nontender and supple without adenopathy or JVD. Back is nontender and there is no CVA tenderness. Lungs are clear without rales, wheezes, or  rhonchi. Chest is nontender. Heart has regular rate and rhythm without murmur. Abdomen is soft, flat, nontender without masses or hepatosplenomegaly and peristalsis is normoactive. Extremities have no cyanosis or edema, full range of motion is present. Skin is warm and dry without rash. Neurologic: Mental status is normal, cranial nerves are intact, there are no motor or sensory deficits.  No objective sensory loss on the face.  ED Results / Procedures / Treatments    Procedures Procedures   Medications Ordered in ED Medications  oxyCODONE-acetaminophen (PERCOCET/ROXICET) 5-325 MG per tablet 1 tablet (has no administration in time range)    ED Course  I have reviewed the triage vital signs and the nursing notes.  MDM Rules/Calculators/A&P Left facial pain consistent with trigeminal neuralgia.  Old records reviewed confirming office visit earlier today with diagnosis of trigeminal neuralgia and prescription for gabapentin.  I have explained to the patient that gabapentin needs to be taken on a regular basis and it is not a pain killer by itself.  Given her failure to get adequate pain relief with over-the-counter analgesics, will prescribe narcotic analgesics for the next several days until gabapentin can start to be effective.  She has also been referred to a neurologist and she is to keep that appointment.  Final Clinical Impression(s) / ED Diagnoses Final diagnoses:  Trigeminal neuralgia    Rx / DC Orders ED Discharge Orders         Ordered    oxyCODONE-acetaminophen (PERCOCET) 5-325 MG tablet  Every 4 hours PRN        12/15/20 2348    oxyCODONE (ROXICODONE) 5 MG immediate release tablet  Every 4 hours PRN        12/15/20 3212           Delora Fuel, MD 24/82/50 0000

## 2020-12-15 NOTE — ED Notes (Signed)
Per pt PCP notes- pain in face concerning for trigeminal neuralgia- pt was prescribed gabapentin. Pt reports she started taking medication today and has not made appt with neurology at this time-pt says she is waiting on them to call her-encouraged pt to call them tomorrow to make an appt.

## 2020-12-15 NOTE — Telephone Encounter (Signed)
See she is scheduled for appt today - can discuss at appt

## 2020-12-16 ENCOUNTER — Telehealth: Payer: Self-pay | Admitting: Family Medicine

## 2020-12-16 NOTE — Telephone Encounter (Signed)
Patient was seen by PCP 12/15/2020 and states the nerve pain worsen and was seen in the emergency department. Caller states gabapentin (NEURONTIN) 300 MG capsule is not working. Emergency department advised patient it takes 3 days for the medication to go into effect and a pain medication should have been prescribed as well. Caller will contact neurologist today to schedule appointment as a new patient but wanted to make PCP aware. Patient would like a follow up call today.    [id number] [address], [place] Phone:  [phone]  Fax:  [phone]

## 2020-12-16 NOTE — Telephone Encounter (Signed)
Please advise 

## 2020-12-16 NOTE — Telephone Encounter (Signed)
Patient advised as below.  

## 2020-12-16 NOTE — Telephone Encounter (Signed)
Look like ED prescribed pain medication. Gabapentin does not work instantly and she will need to continue taking regularly to see the benefit.

## 2020-12-19 ENCOUNTER — Telehealth: Payer: Self-pay

## 2020-12-19 MED FILL — Oxycodone w/ Acetaminophen Tab 5-325 MG: ORAL | Qty: 6 | Status: AC

## 2020-12-19 NOTE — Telephone Encounter (Signed)
Transition Care Management Unsuccessful Follow-up Telephone Call  Date of discharge and from where:  12/16/2020 from [facility]  Attempts:  1st Attempt  Reason for unsuccessful TCM follow-up call:  Left voice message

## 2020-12-20 NOTE — Telephone Encounter (Signed)
Transition Care Management Unsuccessful Follow-up Telephone Call  Date of discharge and from where:  12/16/2020 from [facility]  Attempts:  2nd Attempt  Reason for unsuccessful TCM follow-up call:  Left voice message

## 2020-12-21 NOTE — Telephone Encounter (Signed)
Transition Care Management Follow-up Telephone Call  Date of discharge and from where: 12/16/2020 - [facility]  How have you been since you were released from the hospital? "About the same"  Any questions or concerns? No  Items Reviewed:  Did the pt receive and understand the discharge instructions provided? Yes   Medications obtained and verified? Yes   Other? No   Any new allergies since your discharge? No   Dietary orders reviewed? Yes  Do you have support at home? Yes   Home Care and Equipment/Supplies: Were home health services ordered? not applicable If so, what is the name of the agency? N/A  Has the agency set up a time to come to the patient's home? not applicable Were any new equipment or medical supplies ordered?  No What is the name of the medical supply agency? N/A Were you able to get the supplies/equipment? not applicable Do you have any questions related to the use of the equipment or supplies? No  Functional Questionnaire: (I = Independent and D = Dependent) ADLs: I  Bathing/Dressing- I  Meal Prep- I  Eating- I  Maintaining continence- I  Transferring/Ambulation- I  Managing Meds- I  Follow up appointments reviewed:   PCP Hospital f/u appt confirmed? Yes  Scheduled to see Dr. Brita Romp for a physical on 01/23/2021 @ 1500.  [facility] f/u appt confirmed? No    Are transportation arrangements needed? No   If their condition worsens, is the pt aware to call PCP or go to the Emergency Dept.? Yes  Was the patient provided with contact information for the PCP's office or ED? Yes  Was to pt encouraged to call back with questions or concerns? Yes

## 2020-12-27 ENCOUNTER — Telehealth: Payer: Self-pay

## 2020-12-27 NOTE — Telephone Encounter (Signed)
Copied from [place] [id number]. Topic: General - Other >> Dec 27, 2020  2:59 PM Keene Breath wrote: Reason for CRM: Patient's husband called to ask the nurse or doctor to call regarding a medication that patient's neurologist said they were going to fax to the PCP so that patient would be able to get a script that the insurance will cover.  Please advise and let patient know if this has been sent.  CB# for husband is [phone]

## 2020-12-28 NOTE — Telephone Encounter (Signed)
Please review.    Thanks,   -Shelly Williams  

## 2020-12-28 NOTE — Telephone Encounter (Signed)
Pts husband called and is requesting to have a response. He states that this has been going on since last week. Please advise.

## 2020-12-29 NOTE — Telephone Encounter (Signed)
I have not received a fax from Neurology about a medication for this patient.

## 2020-12-30 ENCOUNTER — Telehealth: Payer: Self-pay

## 2020-12-30 NOTE — Telephone Encounter (Signed)
Copied from [place]. Topic: General - Other >> Dec 30, 2020 11:07 AM Tessa Lerner A wrote: Reason for CRM: Patient's husband has made contact regarding a prescription from patient's neurologist for medication (please see closed encounters from 12/29/20)  Patient's husband will be contacting the neurologist requesting them to resubmit all information related to patient and the potential prescription  The information will be sent via fax from [facility]  Patient's husband would like to be contacted via phone when the information from [facility] is received  Please contact further to advise

## 2020-12-30 NOTE — Telephone Encounter (Signed)
Spoke to patients husband 

## 2021-01-03 ENCOUNTER — Other Ambulatory Visit: Payer: Self-pay | Admitting: Physician Assistant

## 2021-01-03 DIAGNOSIS — G500 Trigeminal neuralgia: Secondary | ICD-10-CM

## 2021-01-04 ENCOUNTER — Other Ambulatory Visit: Payer: Self-pay

## 2021-01-04 NOTE — Telephone Encounter (Signed)
Lmtcb, regarding medication.

## 2021-01-06 ENCOUNTER — Ambulatory Visit
Admission: RE | Admit: 2021-01-06 | Discharge: 2021-01-06 | Disposition: A | Discharge status: Home / Self Care | Payer: Medicaid Other | Source: Ambulatory Visit | Attending: Physician Assistant | Admitting: Physician Assistant

## 2021-01-06 ENCOUNTER — Other Ambulatory Visit: Payer: Self-pay

## 2021-01-06 DIAGNOSIS — G500 Trigeminal neuralgia: Secondary | ICD-10-CM

## 2021-01-09 ENCOUNTER — Ambulatory Visit: Admission: RE | Admit: 2021-01-09 | Payer: Medicaid Other | Source: Ambulatory Visit

## 2021-01-18 ENCOUNTER — Encounter: Payer: Self-pay | Admitting: Family Medicine

## 2021-01-21 ENCOUNTER — Ambulatory Visit: Admission: RE | Admit: 2021-01-21 | Payer: Medicaid Other | Source: Ambulatory Visit

## 2021-01-23 ENCOUNTER — Encounter: Payer: Self-pay | Admitting: Family Medicine

## 2021-01-25 ENCOUNTER — Ambulatory Visit
Admission: RE | Admit: 2021-01-25 | Discharge: 2021-01-25 | Disposition: A | Discharge status: Home / Self Care | Payer: Medicaid Other | Source: Ambulatory Visit | Attending: Neurology | Admitting: Neurology

## 2021-01-25 ENCOUNTER — Other Ambulatory Visit: Payer: Self-pay | Admitting: Neurology

## 2021-01-25 ENCOUNTER — Other Ambulatory Visit: Payer: Self-pay

## 2021-01-25 DIAGNOSIS — K0889 Other specified disorders of teeth and supporting structures: Secondary | ICD-10-CM | POA: Insufficient documentation

## 2021-01-25 DIAGNOSIS — R519 Headache, unspecified: Secondary | ICD-10-CM

## 2021-01-25 MED ORDER — GADOBUTROL 1 MMOL/ML IV SOLN
8.0000 mL | Freq: Once | INTRAVENOUS | Status: AC | PRN
  Administered 2021-01-25: 8 mL via INTRAVENOUS

## 2021-01-28 ENCOUNTER — Ambulatory Visit: Payer: Medicaid Other

## 2021-02-14 ENCOUNTER — Encounter: Payer: Self-pay | Admitting: Family Medicine

## 2021-02-14 ENCOUNTER — Other Ambulatory Visit: Payer: Self-pay

## 2021-02-14 ENCOUNTER — Ambulatory Visit (INDEPENDENT_AMBULATORY_CARE_PROVIDER_SITE_OTHER): Payer: Medicaid Other | Admitting: Family Medicine

## 2021-02-14 VITALS — BP 92/66 | HR 65 | Temp 98.0°F | Ht 62.0 in | Wt 174.0 lb

## 2021-02-14 DIAGNOSIS — Z0000 Encounter for general adult medical examination without abnormal findings: Secondary | ICD-10-CM

## 2021-02-14 DIAGNOSIS — Z6831 Body mass index (BMI) 31.0-31.9, adult: Secondary | ICD-10-CM

## 2021-02-14 DIAGNOSIS — D649 Anemia, unspecified: Secondary | ICD-10-CM | POA: Insufficient documentation

## 2021-02-14 DIAGNOSIS — G4700 Insomnia, unspecified: Secondary | ICD-10-CM

## 2021-02-14 DIAGNOSIS — H66001 Acute suppurative otitis media without spontaneous rupture of ear drum, right ear: Secondary | ICD-10-CM | POA: Diagnosis not present

## 2021-02-14 DIAGNOSIS — E669 Obesity, unspecified: Secondary | ICD-10-CM | POA: Diagnosis not present

## 2021-02-14 DIAGNOSIS — Z1231 Encounter for screening mammogram for malignant neoplasm of breast: Secondary | ICD-10-CM | POA: Diagnosis not present

## 2021-02-14 MED ORDER — AMOXICILLIN-POT CLAVULANATE 875-125 MG PO TABS: 1.0000 | ORAL_TABLET | Freq: Two times a day (BID) | ORAL | 0 refills | Status: DC

## 2021-02-14 MED ORDER — TRAZODONE HCL 50 MG PO TABS: 25.0000 mg | ORAL_TABLET | Freq: Every evening | ORAL | 3 refills | Status: DC | PRN

## 2021-02-14 NOTE — Assessment & Plan Note (Signed)
Mildly low Hgb noted on last labs Recheck today

## 2021-02-14 NOTE — Patient Instructions (Signed)
Call to schedule a mammogram  [phone]    Preventive Care 39-40 Years Old, Female Preventive care refers to lifestyle choices and visits with your health care provider that can promote health and wellness. This includes:  A yearly physical exam. This is also called an annual wellness visit.  Regular dental and eye exams.  Immunizations.  Screening for certain conditions.  Healthy lifestyle choices, such as: ? Eating a healthy diet. ? Getting regular exercise. ? Not using drugs or products that contain nicotine and tobacco. ? Limiting alcohol use. What can I expect for my preventive care visit? Physical exam Your health care provider will check your:  Height and weight. These may be used to calculate your BMI (body mass index). BMI is a measurement that tells if you are at a healthy weight.  Heart rate and blood pressure.  Body temperature.  Skin for abnormal spots. Counseling Your health care provider may ask you questions about your:  Past medical problems.  Family's medical history.  Alcohol, tobacco, and drug use.  Emotional well-being.  Home life and relationship well-being.  Sexual activity.  Diet, exercise, and sleep habits.  Work and work Statistician.  Access to firearms.  Method of birth control.  Menstrual cycle.  Pregnancy history. What immunizations do I need? Vaccines are usually given at various ages, according to a schedule. Your health care provider will recommend vaccines for you based on your age, medical history, and lifestyle or other factors, such as travel or where you work.   What tests do I need? Blood tests  Lipid and cholesterol levels. These may be checked every 5 years, or more often if you are over 69 years old.  Hepatitis C test.  Hepatitis B test. Screening  Lung cancer screening. You may have this screening every year starting at age 97 if you have a 30-pack-year history of smoking and currently smoke or have quit  within the past 15 years.  Colorectal cancer screening. ? All adults should have this screening starting at age 32 and continuing until age 60. ? Your health care provider may recommend screening at age 38 if you are at increased risk. ? You will have tests every 1-10 years, depending on your results and the type of screening test.  Diabetes screening. ? This is done by checking your blood sugar (glucose) after you have not eaten for a while (fasting). ? You may have this done every 1-3 years.  Mammogram. ? This may be done every 1-2 years. ? Talk with your health care provider about when you should start having regular mammograms. This may depend on whether you have a family history of breast cancer.  BRCA-related cancer screening. This may be done if you have a family history of breast, ovarian, tubal, or peritoneal cancers.  Pelvic exam and Pap test. ? This may be done every 3 years starting at age 36. ? Starting at age 52, this may be done every 5 years if you have a Pap test in combination with an HPV test. Other tests  STD (sexually transmitted disease) testing, if you are at risk.  Bone density scan. This is done to screen for osteoporosis. You may have this scan if you are at high risk for osteoporosis. Talk with your health care provider about your test results, treatment options, and if necessary, the need for more tests. Follow these instructions at home: Eating and drinking  Eat a diet that includes fresh fruits and vegetables, whole grains, lean protein,  and low-fat dairy products.  Take vitamin and mineral supplements as recommended by your health care provider.  Do not drink alcohol if: ? Your health care provider tells you not to drink. ? You are pregnant, may be pregnant, or are planning to become pregnant.  If you drink alcohol: ? Limit how much you have to 0-1 drink a day. ? Be aware of how much alcohol is in your drink. In the [place] one drink equals one 12  oz bottle of beer (355 mL), one 5 oz glass of wine (148 mL), or one 1 oz glass of hard liquor (44 mL).   Lifestyle  Take daily care of your teeth and gums. Brush your teeth every morning and night with fluoride toothpaste. Floss one time each day.  Stay active. Exercise for at least 30 minutes 5 or more days each week.  Do not use any products that contain nicotine or tobacco, such as cigarettes, e-cigarettes, and chewing tobacco. If you need help quitting, ask your health care provider.  Do not use drugs.  If you are sexually active, practice safe sex. Use a condom or other form of protection to prevent STIs (sexually transmitted infections).  If you do not wish to become pregnant, use a form of birth control. If you plan to become pregnant, see your health care provider for a prepregnancy visit.  If told by your health care provider, take low-dose aspirin daily starting at age 15.  Find healthy ways to cope with stress, such as: ? Meditation, yoga, or listening to music. ? Journaling. ? Talking to a trusted person. ? Spending time with friends and family. Safety  Always wear your seat belt while driving or riding in a vehicle.  Do not drive: ? If you have been drinking alcohol. Do not ride with someone who has been drinking. ? When you are tired or distracted. ? While texting.  Wear a helmet and other protective equipment during sports activities.  If you have firearms in your house, make sure you follow all gun safety procedures. What's next?  Visit your health care provider once a year for an annual wellness visit.  Ask your health care provider how often you should have your eyes and teeth checked.  Stay up to date on all vaccines. This information is not intended to replace advice given to you by your health care provider. Make sure you discuss any questions you have with your health care provider. Document Revised: 06/28/2020 Document Reviewed: 06/05/2018 Elsevier  Patient Education  2021 [employer].

## 2021-02-14 NOTE — Progress Notes (Signed)
Complete physical exam   Patient: Shelly Williams   DOB: 10-13-1980   40 y.o. Female  MRN: [id number] Visit Date: 02/14/2021  Today's healthcare provider: Lavon Paganini, MD   Chief Complaint  Patient presents with  . Annual Exam  . Ear Pain   Subjective    Shelly Williams is a 40 y.o. female who presents today for a complete physical exam.  She reports consuming a general diet. Exercises regularly. She generally feels well. She reports sleeping poorly.  Pt states she wakes up at night and is not able to fall back to sleep.   She does have additional problems to discuss today.   Shelly Williams reports she is feeling well besides recently experiencing some ear pain.  Sleep She says her energy has been okay. She says she has only been sleeping a few hours at a time.She has had these symptoms for 1 to 2 months. In the past she has tried Melatonin but she didn't see any improvements in her sleep.  Medications She is currently taking 50mg  tramadol for nerve pain.   Otalgia  There is pain in the right ear. This is a new problem. The current episode started in the past 7 days. The problem has been waxing and waning. There has been no fever. Pertinent negatives include no abdominal pain, coughing, diarrhea, ear discharge, headaches, hearing loss, rhinorrhea, sore throat or vomiting. She has tried NSAIDs and acetaminophen for the symptoms. The treatment provided mild relief.      Past Medical History:  Diagnosis Date  . Abdominal pain 2014  . Depression   . Gallstones 2014  . History of kidney stones    Past Surgical History:  Procedure Laterality Date  . BLADDER SUSPENSION N/A 06/28/2020   Procedure: PUBOVAGINAL SLING  PROCEDURE - TVT EXACT;  Surgeon: Gae Dry, MD;  Location: [facility] ORS;  Service: Gynecology;  Laterality: N/A;  . CHOLECYSTECTOMY    . CYSTOCELE REPAIR N/A 06/28/2020   Procedure: ANTERIOR REPAIR (CYSTOCELE);  Surgeon: Gae Dry, MD;  Location: [facility] ORS;   Service: Gynecology;  Laterality: N/A;  . CYSTOSCOPY  06/28/2020   Procedure: CYSTOSCOPY;  Surgeon: Gae Dry, MD;  Location: [facility] ORS;  Service: Gynecology;;  . TUBAL LIGATION     Social History   Socioeconomic History  . Marital status: Married    Spouse name: Not on file  . Number of children: 4  . Years of education: Not on file  . Highest education level: Not on file  Occupational History  . Not on file  Tobacco Use  . Smoking status: Never Smoker  . Smokeless tobacco: Never Used  Vaping Use  . Vaping Use: Never used  Substance and Sexual Activity  . Alcohol use: No  . Drug use: No  . Sexual activity: Yes    Partners: Male    Birth control/protection: Surgical  Other Topics Concern  . Not on file  Social History Narrative  . Not on file   Social Determinants of Health   Financial Resource Strain: Not on file  Food Insecurity: Not on file  Transportation Needs: Not on file  Physical Activity: Not on file  Stress: Not on file  Social Connections: Not on file  Intimate Partner Violence: Not on file   Family Status  Relation Name Status  . Mat Aunt  Deceased  . Other  (Not Specified)  . Mat Aunt  Deceased  . Mother  Alive  . Father  Alive  .  Sister  Alive  . Brother  Alive  . Daughter  Alive  . Son  Alive  . Daughter  Alive  . Daughter  Alive  . Neg Hx  (Not Specified)   Family History  Problem Relation Age of Onset  . Lung cancer Maternal Aunt        lung  . Cancer Other        lung - maternal great aunt  . Breast cancer Maternal Aunt        breast  . Healthy Mother   . Healthy Father   . Healthy Sister   . Healthy Brother   . Healthy Daughter   . Healthy Son   . Healthy Daughter   . Healthy Daughter   . Colon cancer Neg Hx   . Ovarian cancer Neg Hx    No Known Allergies  Patient Care Team: Virginia Crews, MD as PCP - General (Family Medicine) Christene Lye, MD (General Surgery) Christene Lye, MD (General  Surgery) Luna Glasgow, DO (Family Medicine)   Medications: Outpatient Medications Prior to Visit  Medication Sig  . traMADol (ULTRAM) 50 MG tablet Take by mouth.  . [DISCONTINUED] carbamazepine (TEGRETOL) 200 MG tablet Take 100 mg (1/2 tablet) twice a day for 1 week, then increase to 200 mg twice a day.  . [DISCONTINUED] gabapentin (NEURONTIN) 300 MG capsule Take 1 capsule (300 mg total) by mouth 3 (three) times daily.  . [DISCONTINUED] oxyCODONE (ROXICODONE) 5 MG immediate release tablet Take 1 tablet (5 mg total) by mouth every 4 (four) hours as needed for severe pain.  . [DISCONTINUED] oxyCODONE-acetaminophen (PERCOCET) 5-325 MG tablet Take 1 tablet by mouth every 4 (four) hours as needed for moderate pain.   No facility-administered medications prior to visit.    Review of Systems  Constitutional: Negative.  Negative for chills, fatigue and fever.  HENT: Positive for ear pain. Negative for congestion, dental problem, drooling, ear discharge, hearing loss, postnasal drip, rhinorrhea, sinus pressure, sinus pain, sneezing, sore throat, tinnitus, trouble swallowing and voice change.   Eyes: Negative.  Negative for pain.  Respiratory: Negative.  Negative for cough, chest tightness, shortness of breath and wheezing.   Cardiovascular: Negative.  Negative for chest pain, palpitations and leg swelling.  Gastrointestinal: Negative.  Negative for abdominal pain, blood in stool, diarrhea, nausea and vomiting.  Endocrine: Negative.   Genitourinary: Negative.  Negative for dysuria, flank pain, frequency, pelvic pain, urgency and vaginal pain.  Musculoskeletal: Negative.   Skin: Negative.   Allergic/Immunologic: Negative.   Neurological: Negative.  Negative for dizziness, syncope, weakness, numbness and headaches.  Hematological: Positive for adenopathy. Does not bruise/bleed easily.  Psychiatric/Behavioral: Negative.       Objective    BP 92/66 (BP Location: Right Arm, Patient  Position: Sitting, Cuff Size: Large)   Pulse 65   Temp 98 F (36.7 C) (Oral)   Ht 5\' 2"  (1.575 m)   Wt 174 lb (78.9 kg)   BMI 31.83 kg/m    Physical Exam Vitals reviewed.  Constitutional:      General: She is not in acute distress.    Appearance: Normal appearance. She is well-developed. She is not diaphoretic.  HENT:     Head: Normocephalic and atraumatic.     Right Ear: Ear canal and external ear normal. Tympanic membrane is erythematous and bulging.     Left Ear: Tympanic membrane, ear canal and external ear normal.     Ears:     Comments:  Right TM redness     Nose: Nose normal.     Mouth/Throat:     Mouth: Mucous membranes are moist.     Pharynx: Oropharynx is clear. No oropharyngeal exudate.  Eyes:     General: No scleral icterus.    Conjunctiva/sclera: Conjunctivae normal.     Pupils: Pupils are equal, round, and reactive to light.  Neck:     Thyroid: No thyromegaly.  Cardiovascular:     Rate and Rhythm: Normal rate and regular rhythm.     Pulses: Normal pulses.     Heart sounds: Normal heart sounds. No murmur heard.   Pulmonary:     Effort: Pulmonary effort is normal. No respiratory distress.     Breath sounds: Normal breath sounds. No wheezing or rales.  Abdominal:     General: There is no distension.     Palpations: Abdomen is soft.     Tenderness: There is no abdominal tenderness.  Musculoskeletal:        General: No deformity.     Cervical back: Neck supple.     Right lower leg: No edema.     Left lower leg: No edema.  Lymphadenopathy:     Cervical: No cervical adenopathy.  Skin:    General: Skin is warm and dry.     Findings: No rash.  Neurological:     Mental Status: She is alert and oriented to person, place, and time. Mental status is at baseline.     Sensory: No sensory deficit.     Motor: No weakness.     Gait: Gait normal.  Psychiatric:        Mood and Affect: Mood normal.        Behavior: Behavior normal.        Thought Content: Thought  content normal.       Last depression screening scores PHQ 2/9 Scores 02/14/2021 12/15/2020 08/18/2020  PHQ - 2 Score 2 6 2   PHQ- 9 Score 7 10 3    Last fall risk screening Fall Risk  02/14/2021  Falls in the past year? 0  Number falls in past yr: 0  Injury with Fall? 0  Risk for fall due to : No Fall Risks  Follow up Falls evaluation completed   Last Audit-C alcohol use screening Alcohol Use Disorder Test (AUDIT) 02/14/2021  1. How often do you have a drink containing alcohol? 1  2. How many drinks containing alcohol do you have on a typical day when you are drinking? 0  3. How often do you have six or more drinks on one occasion? 0  AUDIT-C Score 1  Alcohol Brief Interventions/Follow-up -   A score of 3 or more in women, and 4 or more in men indicates increased risk for alcohol abuse, EXCEPT if all of the points are from question 1   No results found for any visits on 02/14/21.  Assessment & Plan     Problem List Items Addressed This Visit      Nervous and Auditory   Non-recurrent acute suppurative otitis media of right ear without spontaneous rupture of tympanic membrane    - symptoms and exam c/w R AOM without rupture - no evidence of sinusitis, CAP, strep pharyngitis, or other infection - will treat with Augmentin x7d - discussed symptomatic management (flonase, tylenol, etc), natural course, and return precautions         Relevant Medications   amoxicillin-clavulanate (AUGMENTIN) 875-125 MG tablet     Other  Obesity    Discussed importance of healthy weight management Discussed diet and exercise       Relevant Orders   Lipid panel   Comprehensive metabolic panel   CBC w/Diff/Platelet   Insomnia    New problem Trouble staying asleep Failed melatonin Trial of low-dose trazodone as needed      Anemia    Mildly low Hgb noted on last labs Recheck today      Relevant Orders   CBC w/Diff/Platelet    Other Visit Diagnoses    Encounter for annual  physical exam    -  Primary   Relevant Orders   MM 3D SCREEN BREAST BILATERAL   Lipid panel   Comprehensive metabolic panel   CBC w/Diff/Platelet   Encounter for screening mammogram for malignant neoplasm of breast       Relevant Orders   MM 3D SCREEN BREAST BILATERAL      Routine Health Maintenance and Physical Exam  Exercise Activities and Dietary recommendations Goals   None     Immunization History  Administered Date(s) Administered  . Influenza,inj,Quad PF,6+ Mos 08/31/2019, 08/18/2020  . Janssen (J&J) SARS-COV-2 Vaccination 12/19/2019, 01/07/2020  . PFIZER(Purple Top)SARS-COV-2 Vaccination 08/04/2020  . Tdap 03/10/2014    Health Maintenance  Topic Date Due  . Hepatitis C Screening  Never done  . INFLUENZA VACCINE  05/08/2021  . TETANUS/TDAP  03/10/2024  . PAP SMEAR-Modifier  01/17/2025  . COVID-19 Vaccine  Completed  . HIV Screening  Completed  . HPV VACCINES  Aged Out    Discussed health benefits of physical activity, and encouraged her to engage in regular exercise appropriate for her age and condition.    Return in about 1 year (around 02/14/2022) for CPE.     I,Essence Turner,acting as a Education administrator for Lavon Paganini, MD.,have documented all relevant documentation on the behalf of Lavon Paganini, MD,as directed by  Lavon Paganini, MD while in the presence of Lavon Paganini, MD.  I, Lavon Paganini, MD, have reviewed all documentation for this visit. The documentation on 02/14/21 for the exam, diagnosis, procedures, and orders are all accurate and complete.   Shelly Williams, Dionne Bucy, MD, MPH [facility]

## 2021-02-14 NOTE — Assessment & Plan Note (Signed)
New problem Trouble staying asleep Failed melatonin Trial of low-dose trazodone as needed

## 2021-02-14 NOTE — Assessment & Plan Note (Signed)
-   symptoms and exam c/w R AOM without rupture - no evidence of sinusitis, CAP, strep pharyngitis, or other infection - will treat with Augmentin x7d - discussed symptomatic management (flonase, tylenol, etc), natural course, and return precautions

## 2021-02-14 NOTE — Assessment & Plan Note (Signed)
Discussed importance of healthy weight management Discussed diet and exercise  

## 2021-02-15 LAB — CBC WITH DIFFERENTIAL/PLATELET
Basophils Absolute: 0 10*3/uL (ref 0.0–0.2)
Basos: 1 %
EOS (ABSOLUTE): 0.3 10*3/uL (ref 0.0–0.4)
Eos: 5 %
Hematocrit: 35.4 % (ref 34.0–46.6)
Hemoglobin: 11 g/dL — ABNORMAL LOW (ref 11.1–15.9)
Immature Grans (Abs): 0 10*3/uL (ref 0.0–0.1)
Immature Granulocytes: 0 %
Lymphocytes Absolute: 1.8 10*3/uL (ref 0.7–3.1)
Lymphs: 33 %
MCH: 23.9 pg — ABNORMAL LOW (ref 26.6–33.0)
MCHC: 31.1 g/dL — ABNORMAL LOW (ref 31.5–35.7)
MCV: 77 fL — ABNORMAL LOW (ref 79–97)
Monocytes Absolute: 0.5 10*3/uL (ref 0.1–0.9)
Monocytes: 9 %
Neutrophils Absolute: 2.9 10*3/uL (ref 1.4–7.0)
Neutrophils: 52 %
Platelets: 230 10*3/uL (ref 150–450)
RBC: 4.61 x10E6/uL (ref 3.77–5.28)
RDW: 17.9 % — ABNORMAL HIGH (ref 11.7–15.4)
WBC: 5.5 10*3/uL (ref 3.4–10.8)

## 2021-02-15 LAB — COMPREHENSIVE METABOLIC PANEL
ALT: 12 IU/L (ref 0–32)
AST: 16 IU/L (ref 0–40)
Albumin/Globulin Ratio: 1.6 (ref 1.2–2.2)
Albumin: 4.2 g/dL (ref 3.8–4.8)
Alkaline Phosphatase: 88 IU/L (ref 44–121)
BUN/Creatinine Ratio: 18 (ref 9–23)
BUN: 11 mg/dL (ref 6–24)
Bilirubin Total: 0.4 mg/dL (ref 0.0–1.2)
CO2: 23 mmol/L (ref 20–29)
Calcium: 9.1 mg/dL (ref 8.7–10.2)
Chloride: 104 mmol/L (ref 96–106)
Creatinine, Ser: 0.61 mg/dL (ref 0.57–1.00)
Globulin, Total: 2.6 g/dL (ref 1.5–4.5)
Glucose: 98 mg/dL (ref 65–99)
Potassium: 4.4 mmol/L (ref 3.5–5.2)
Sodium: 141 mmol/L (ref 134–144)
Total Protein: 6.8 g/dL (ref 6.0–8.5)
eGFR: 116 mL/min/{1.73_m2} (ref >59)

## 2021-02-15 LAB — LIPID PANEL
Chol/HDL Ratio: 3.2 ratio (ref 0.0–4.4)
Cholesterol, Total: 159 mg/dL (ref 100–199)
HDL: 49 mg/dL (ref >39)
LDL Chol Calc (NIH): 97 mg/dL (ref 0–99)
Triglycerides: 69 mg/dL (ref 0–149)
VLDL Cholesterol Cal: 13 mg/dL (ref 5–40)

## 2021-02-16 ENCOUNTER — Telehealth: Payer: Self-pay

## 2021-02-16 MED ORDER — DOXYCYCLINE HYCLATE 100 MG PO TABS: 100.0000 mg | ORAL_TABLET | Freq: Two times a day (BID) | ORAL | 0 refills | Status: AC

## 2021-02-16 NOTE — Telephone Encounter (Signed)
We can try to switch her to another antibiotic, she can add tylenol and flonase, or she can see whoever has availability/virtual/[place]. If I have availability, ok with seeing her.

## 2021-02-16 NOTE — Telephone Encounter (Signed)
Please send in Rx for doxycycline 100mg  BID x7 d #14 r0. Thanks!

## 2021-02-16 NOTE — Addendum Note (Signed)
Addended by: Minette Headland on: 02/16/2021 03:20 PM   Modules accepted: Orders

## 2021-02-16 NOTE — Telephone Encounter (Signed)
Copied from [place] [phone]. Topic: Appointment Scheduling - Scheduling Inquiry for Clinic >> Feb 16, 2021 10:20 AM Rayann Heman wrote: Reason for CRM: Patient called and stated that she was seen on 5/10 for an ear infection. She states that she is getting worse and would like to schedule another appointment with Dr B. Please advise

## 2021-02-16 NOTE — Telephone Encounter (Signed)
Patient advised she states that she would like to change antibiotic and would like for you to send to [facility] [address]

## 2021-03-08 ENCOUNTER — Other Ambulatory Visit: Payer: Self-pay | Admitting: Family Medicine

## 2021-03-23 ENCOUNTER — Ambulatory Visit (INDEPENDENT_AMBULATORY_CARE_PROVIDER_SITE_OTHER): Payer: Medicaid Other | Admitting: Family Medicine

## 2021-03-23 ENCOUNTER — Other Ambulatory Visit: Payer: Self-pay

## 2021-03-23 DIAGNOSIS — Z5329 Procedure and treatment not carried out because of patient's decision for other reasons: Secondary | ICD-10-CM

## 2021-03-23 NOTE — Progress Notes (Signed)
No visit, appointment was cancelled.

## 2021-05-30 ENCOUNTER — Encounter: Payer: Self-pay | Admitting: Family Medicine

## 2021-05-30 ENCOUNTER — Ambulatory Visit: Payer: Medicaid Other | Admitting: Family Medicine

## 2021-05-30 ENCOUNTER — Other Ambulatory Visit: Payer: Self-pay

## 2021-05-30 VITALS — BP 92/66 | HR 75 | Temp 98.3°F | Resp 16 | Ht 62.0 in | Wt 186.7 lb

## 2021-05-30 DIAGNOSIS — K219 Gastro-esophageal reflux disease without esophagitis: Secondary | ICD-10-CM

## 2021-05-30 DIAGNOSIS — G500 Trigeminal neuralgia: Secondary | ICD-10-CM

## 2021-05-30 DIAGNOSIS — R059 Cough, unspecified: Secondary | ICD-10-CM | POA: Diagnosis not present

## 2021-05-30 MED ORDER — OMEPRAZOLE 20 MG PO CPDR: 20.0000 mg | DELAYED_RELEASE_CAPSULE | Freq: Every day | ORAL | 3 refills | Status: DC

## 2021-05-30 MED ORDER — TRAMADOL HCL 50 MG PO TABS: 50.0000 mg | ORAL_TABLET | Freq: Four times a day (QID) | ORAL | 0 refills | Status: DC | PRN

## 2021-05-30 NOTE — Assessment & Plan Note (Signed)
Uncontrolled Failed gabapentin due to intolerance due to drowsiness F/b neuro - encouraged her to f/u  Refilled tramadol today Might consider lyrica vs amitriptyline in the future

## 2021-05-30 NOTE — Assessment & Plan Note (Signed)
As above, suspect GERD Not on ACEi Normal pulm exam

## 2021-05-30 NOTE — Progress Notes (Deleted)
      Established patient visit   Patient: Shelly Williams   DOB: January 21, 1981   40 y.o. Female  MRN: [id number] Visit Date: 05/30/2021  Today's healthcare provider: Lavon Paganini, MD   No chief complaint on file.  Subjective  -------------------------------------------------------------------------------------------------------------------- HPI  ***  Patient Active Problem List   Diagnosis Date Noted   Insomnia 02/14/2021   Non-recurrent acute suppurative otitis media of right ear without spontaneous rupture of tympanic membrane 02/14/2021   Anemia 02/14/2021   Cystocele, midline 02/15/2020   Urinary, incontinence, stress female 01/19/2020   Obesity 08/31/2019   Transient neurological symptoms 08/31/2019   Chronic pain of left knee 08/31/2019   Past Medical History:  Diagnosis Date   Abdominal pain 2014   Depression    Gallstones 2014   History of kidney stones    No Known Allergies     Medications: Outpatient Medications Prior to Visit  Medication Sig   traMADol (ULTRAM) 50 MG tablet Take by mouth.   traZODone (DESYREL) 50 MG tablet TAKE 0.5-1 TABLETS BY MOUTH AT BEDTIME AS NEEDED FOR SLEEP.   No facility-administered medications prior to visit.    Review of Systems  Last CBC Lab Results  Component Value Date   WBC 5.5 02/14/2021   HGB 11.0 (L) 02/14/2021   HCT 35.4 02/14/2021   MCV 77 (L) 02/14/2021   MCH 23.9 (L) 02/14/2021   RDW 17.9 (H) 02/14/2021   PLT 230 [id number]   Last metabolic panel Lab Results  Component Value Date   GLUCOSE 98 02/14/2021   NA 141 02/14/2021   K 4.4 02/14/2021   CL 104 02/14/2021   CO2 23 02/14/2021   BUN 11 02/14/2021   CREATININE 0.61 02/14/2021   GFRNONAA 118 01/28/2020   GFRAA 136 01/28/2020   CALCIUM 9.1 02/14/2021   PROT 6.8 02/14/2021   ALBUMIN 4.2 02/14/2021   LABGLOB 2.6 02/14/2021   AGRATIO 1.6 02/14/2021   BILITOT 0.4 02/14/2021   ALKPHOS 88 02/14/2021   AST 16 02/14/2021   ALT 12 02/14/2021    ANIONGAP 7 12/08/2012   Last lipids Lab Results  Component Value Date   CHOL 159 02/14/2021   HDL 49 02/14/2021   LDLCALC 97 02/14/2021   TRIG 69 02/14/2021   CHOLHDL 3.2 02/14/2021   Last thyroid functions Lab Results  Component Value Date   TSH 1.600 01/28/2020       Objective  -------------------------------------------------------------------------------------------------------------------- There were no vitals taken for this visit. BP Readings from Last 3 Encounters:  02/14/21 92/66  12/16/20 130/62  12/15/20 100/64   Wt Readings from Last 3 Encounters:  02/14/21 174 lb (78.9 kg)  12/15/20 181 lb 14.1 oz (82.5 kg)  12/15/20 181 lb 14.4 oz (82.5 kg)       Physical Exam  ***  No results found for any visits on 05/30/21.  Assessment & Plan  ---------------------------------------------------------------------------------------------------------------------- ***  No follow-ups on file.      {provider attestation***:1}   Lavon Paganini, MD  [facility] [phone] (phone) [phone] (fax)  [place]

## 2021-05-30 NOTE — Progress Notes (Signed)
Established patient visit   Patient: Shelly Williams   DOB: 13-Jun-1981   40 y.o. Female  MRN: [id number] Visit Date: 05/30/2021  Today's healthcare provider: Lavon Paganini, MD   Chief Complaint  Patient presents with   Cough   Subjective  -------------------------------------------------------------------------------------------------------------------- Cough This is a recurrent problem. The current episode started more than 1 month ago. The problem has been waxing and waning. Episode frequency: every few nights. The cough is Non-productive (dry). Associated symptoms include shortness of breath. Pertinent negatives include no chest pain, chills, ear pain, fever, headaches, myalgias, nasal congestion, rhinorrhea, sore throat or wheezing. The symptoms are aggravated by lying down. She has tried a beta-agonist inhaler for the symptoms. The treatment provided moderate relief. There is no history of asthma or environmental allergies.   She reports when she wakes and has these coughing spells, she will have a sour taste in her mouth. She reports depending on the food she will have GI upset.   Left facial pain She went to the neurologist, Dr. Melrose Nakayama, and she switched from the gabapentin to tramadol. She stopped taking the tramadol for 2 months and the pain returned 4 days ago. She has since restarted tramadol. She was scheduled for a follow-up but her appointment was canceled. She will re-schedule with Dr. Melrose Nakayama to check in. She reports good pain control.   Patient Active Problem List   Diagnosis Date Noted   Trigeminal neuralgia of left side of face 05/30/2021   Gastroesophageal reflux disease 05/30/2021   Insomnia 02/14/2021   Anemia 02/14/2021   Cough 08/30/2020   Cystocele, midline 02/15/2020   Urinary, incontinence, stress female 01/19/2020   Obesity 08/31/2019   Transient neurological symptoms 08/31/2019   Chronic pain of left knee 08/31/2019   Social History   Tobacco  Use   Smoking status: Never   Smokeless tobacco: Never  Vaping Use   Vaping Use: Never used  Substance Use Topics   Alcohol use: No   Drug use: No   No Known Allergies     Medications: Outpatient Medications Prior to Visit  Medication Sig   traZODone (DESYREL) 50 MG tablet TAKE 0.5-1 TABLETS BY MOUTH AT BEDTIME AS NEEDED FOR SLEEP.   [DISCONTINUED] traMADol (ULTRAM) 50 MG tablet Take by mouth.   No facility-administered medications prior to visit.    Review of Systems  Constitutional:  Negative for activity change, appetite change, chills, fatigue and fever.  HENT:  Negative for ear pain, rhinorrhea, sinus pressure, sinus pain and sore throat.   Eyes:  Negative for pain and visual disturbance.  Respiratory:  Positive for cough and shortness of breath. Negative for chest tightness and wheezing.   Cardiovascular:  Negative for chest pain, palpitations and leg swelling.  Gastrointestinal:  Negative for abdominal pain, blood in stool, diarrhea, nausea and vomiting.  Genitourinary:  Negative for flank pain, frequency, pelvic pain and urgency.  Musculoskeletal:  Negative for back pain, myalgias and neck pain.  Allergic/Immunologic: Negative for environmental allergies.  Neurological:  Negative for dizziness, weakness, light-headedness, numbness and headaches.   Last CBC Lab Results  Component Value Date   WBC 5.5 02/14/2021   HGB 11.0 (L) 02/14/2021   HCT 35.4 02/14/2021   MCV 77 (L) 02/14/2021   MCH 23.9 (L) 02/14/2021   RDW 17.9 (H) 02/14/2021   PLT 230 [id number]   Last metabolic panel Lab Results  Component Value Date   GLUCOSE 98 02/14/2021   NA 141 02/14/2021   K 4.4  02/14/2021   CL 104 02/14/2021   CO2 23 02/14/2021   BUN 11 02/14/2021   CREATININE 0.61 02/14/2021   GFRNONAA 118 01/28/2020   GFRAA 136 01/28/2020   CALCIUM 9.1 02/14/2021   PROT 6.8 02/14/2021   ALBUMIN 4.2 02/14/2021   LABGLOB 2.6 02/14/2021   AGRATIO 1.6 02/14/2021   BILITOT 0.4  02/14/2021   ALKPHOS 88 02/14/2021   AST 16 02/14/2021   ALT 12 02/14/2021   ANIONGAP 7 12/08/2012   Last lipids Lab Results  Component Value Date   CHOL 159 02/14/2021   HDL 49 02/14/2021   LDLCALC 97 02/14/2021   TRIG 69 02/14/2021   CHOLHDL 3.2 02/14/2021   Last thyroid functions Lab Results  Component Value Date   TSH 1.600 01/28/2020       Objective  -------------------------------------------------------------------------------------------------------------------- BP 92/66 (BP Location: Left Arm, Patient Position: Sitting, Cuff Size: Large)   Pulse 75   Temp 98.3 F (36.8 C) (Oral)   Resp 16   Ht '5\' 2"'$  (1.575 m)   Wt 186 lb 11.2 oz (84.7 kg)   LMP 05/09/2021 (Exact Date)   BMI 34.15 kg/m  BP Readings from Last 3 Encounters:  05/30/21 92/66  02/14/21 92/66  12/16/20 130/62   Wt Readings from Last 3 Encounters:  05/30/21 186 lb 11.2 oz (84.7 kg)  02/14/21 174 lb (78.9 kg)  12/15/20 181 lb 14.1 oz (82.5 kg)       Physical Exam Vitals reviewed.  Constitutional:      General: She is not in acute distress.    Appearance: Normal appearance. She is well-developed. She is not diaphoretic.  HENT:     Head: Normocephalic and atraumatic.  Eyes:     General: No scleral icterus.    Conjunctiva/sclera: Conjunctivae normal.  Neck:     Thyroid: No thyromegaly.  Cardiovascular:     Rate and Rhythm: Normal rate and regular rhythm.     Pulses: Normal pulses.     Heart sounds: Normal heart sounds. No murmur heard. Pulmonary:     Effort: Pulmonary effort is normal. No respiratory distress.     Breath sounds: Normal breath sounds. No wheezing, rhonchi or rales.  Musculoskeletal:     Cervical back: Neck supple.     Right lower leg: No edema.     Left lower leg: No edema.  Lymphadenopathy:     Cervical: No cervical adenopathy.  Skin:    General: Skin is warm and dry.     Findings: No rash.  Neurological:     Mental Status: She is alert and oriented to person,  place, and time. Mental status is at baseline.  Psychiatric:        Mood and Affect: Mood normal.        Behavior: Behavior normal.     No results found for any visits on 05/30/21.  Assessment & Plan  ---------------------------------------------------------------------------------------------------------------------- Problem List Items Addressed This Visit       Digestive   Gastroesophageal reflux disease    Suspect cough overnight is related to GERD Start omeprazole      Relevant Medications   omeprazole (PRILOSEC) 20 MG capsule     Nervous and Auditory   Trigeminal neuralgia of left side of face - Primary    Uncontrolled Failed gabapentin due to intolerance due to drowsiness F/b neuro - encouraged her to f/u  Refilled tramadol today Might consider lyrica vs amitriptyline in the future      Relevant Orders   Ambulatory  referral to Neurology     Other   Cough    As above, suspect GERD Not on ACEi Normal pulm exam       Return for CPE, as scheduled.      I,Essence Turner,acting as a Education administrator for Lavon Paganini, MD.,have documented all relevant documentation on the behalf of Lavon Paganini, MD,as directed by  Lavon Paganini, MD while in the presence of Lavon Paganini, MD.   I, Lavon Paganini, MD, have reviewed all documentation for this visit. The documentation on 05/30/21 for the exam, diagnosis, procedures, and orders are all accurate and complete.   Mara Favero, Dionne Bucy, MD, MPH [facility]

## 2021-05-30 NOTE — Assessment & Plan Note (Signed)
Suspect cough overnight is related to GERD Start omeprazole

## 2021-06-02 ENCOUNTER — Ambulatory Visit: Payer: Medicaid Other | Admitting: Family Medicine

## 2021-06-29 IMAGING — MR MR HEAD WO/W CM
13 series · 48 of 48 positions shown · IV contrast (8ml Gadavist)
Comparison: None.

CLINICAL DATA: Left-sided facial pain and jaw pain, difficulty
chewing

EXAM:
MRI HEAD WITHOUT AND WITH CONTRAST
TECHNIQUE: Multiplanar, multiecho pulse sequences of the brain and surrounding
structures were obtained without and with intravenous contrast.
CONTRAST:  8mL GADAVIST GADOBUTROL 1 MMOL/ML IV SOLN

[Series 5: ax dwi_tracew · axial · 3.0mm · 0.65mm/px · z∈[-101,+53]mm · 4 of 48 slices shown]
[im 1/48]
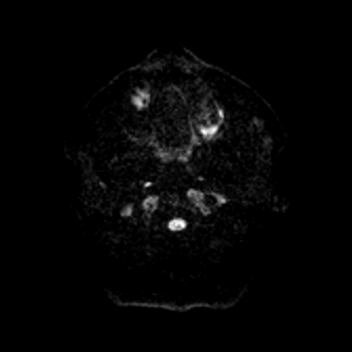
[im 16/48]
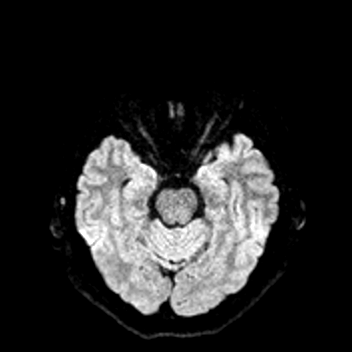
[im 32/48]
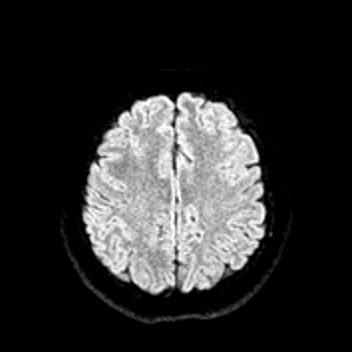
[im 48/48]
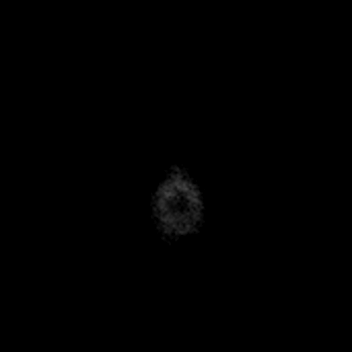

[Series 6: ax dwi_adc · axial · 3.0mm · 0.65mm/px · z∈[-101,+53]mm · 3 of 48 slices shown]
[im 1/48]
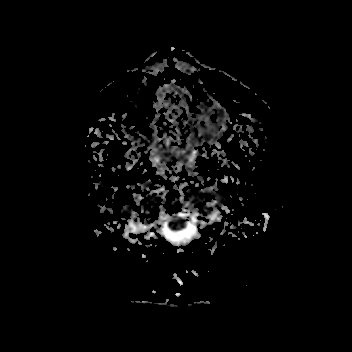
[im 24/48]
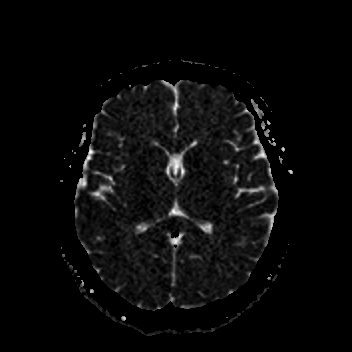
[im 48/48]
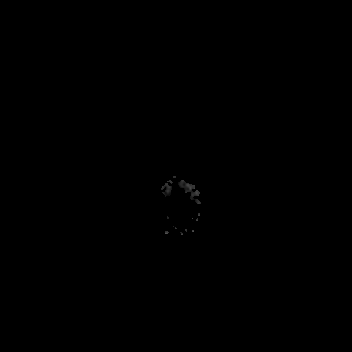

[Series 7: cor dwi_tracew · coronal · 5.0mm · 0.65mm/px · 2 of 40 slices shown]
[im 1/40]
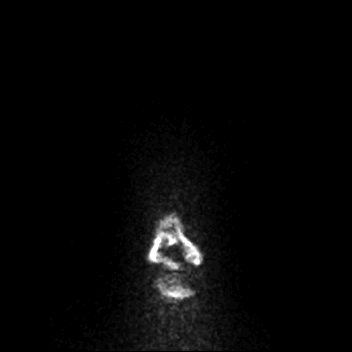
[im 40/40]
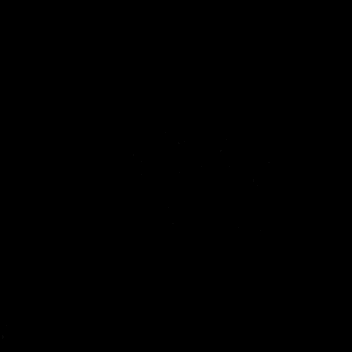

[Series 8: cor dwi_adc · coronal · 5.0mm · 0.65mm/px · 2 of 38 slices shown]
[im 1/38]
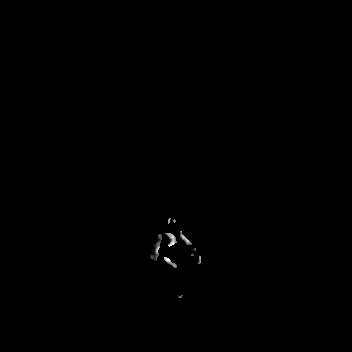
[im 38/38]
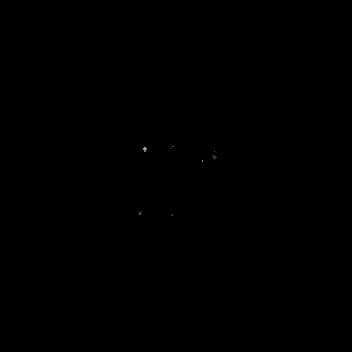

[Series 9: T1 · sagittal · 5.0mm · 0.62mm/px · 1 of 23 slices shown (1 of 2)]
[im 1/23]
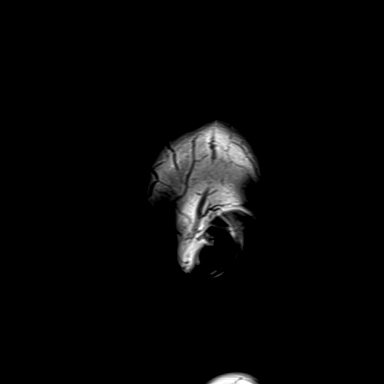

[Series 10: T2 · axial · 5.0mm · 0.53mm/px · 1 of 25 slices shown]
[im 1/25]
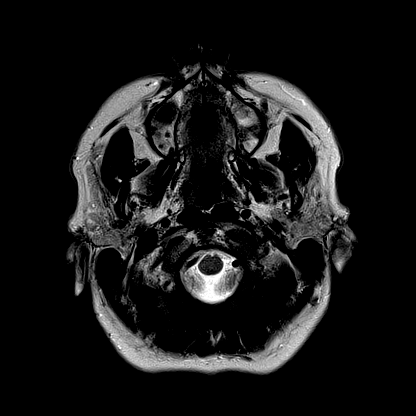

[Series 12: pha_images · axial · 3.0mm · 0.90mm/px · z∈[-112,+62]mm · 4 of 59 slices shown]
[im 1/59]
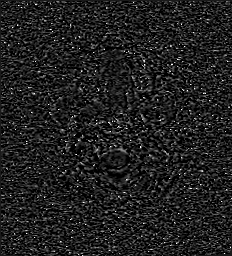
[im 20/59]
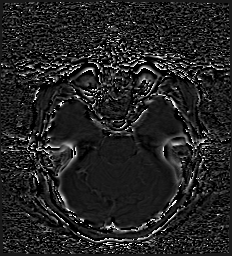
[im 39/59]
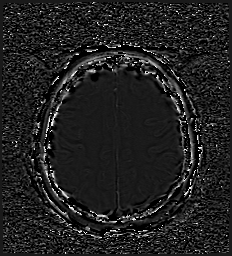
[im 59/59]
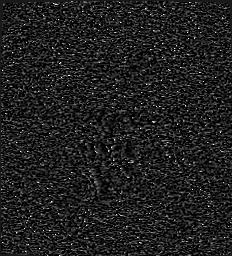

[Series 13: swi_images · axial · 3.0mm · 0.90mm/px · z∈[-112,+65]mm · 4 of 60 slices shown]
[im 1/60]
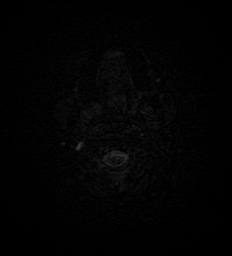
[im 20/60]
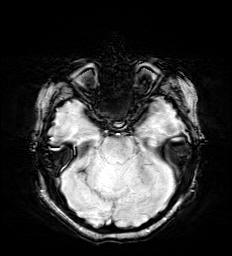
[im 40/60]
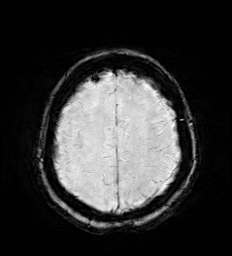
[im 60/60]
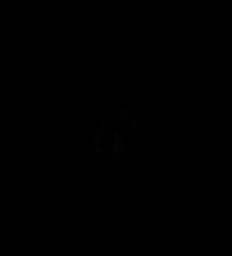

[Series 15: FLAIR · axial · 3.0mm · 0.53mm/px · z∈[-104,+57]mm · 3 of 55 slices shown]
[im 1/55]
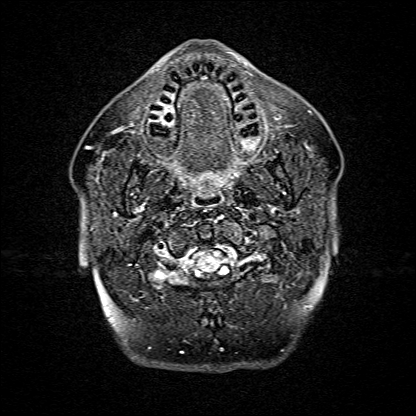
[im 28/55]
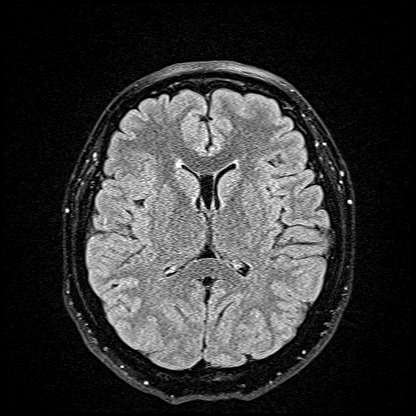
[im 55/55]
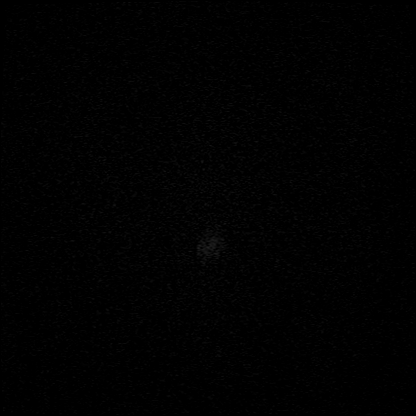

[Series 16: T1 · axial · 1.0mm · 0.98mm/px · z∈[-112,+62]mm · 10 of 175 slices shown (2 of 2)]
[im 1/175]
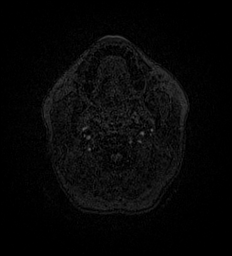
[im 20/175]
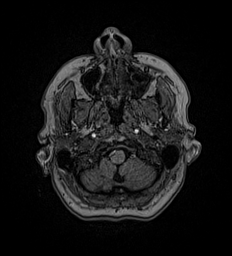
[im 39/175]
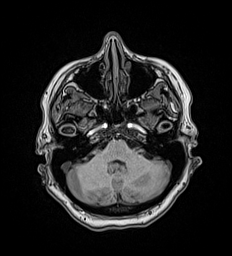
[im 59/175]
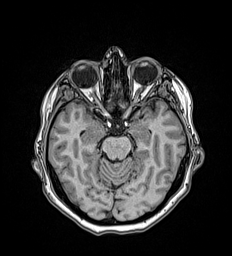
[im 78/175]
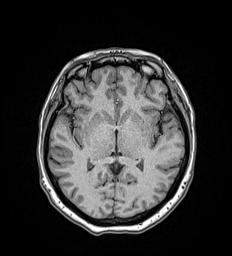
[im 97/175]
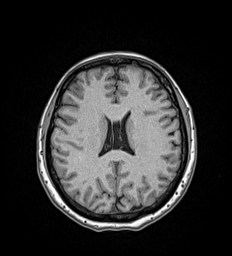
[im 117/175]
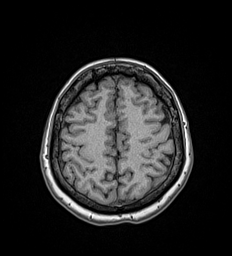
[im 136/175]
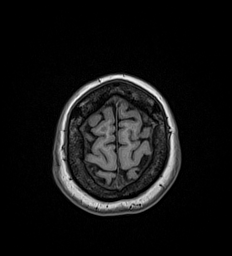
[im 155/175]
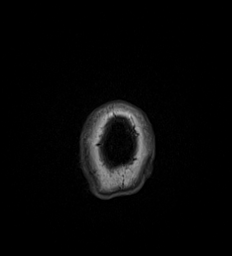
[im 175/175]
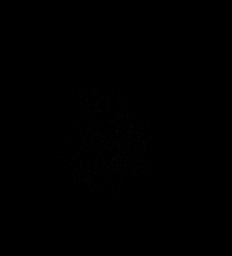

[Series 17: T2 post-contrast · coronal · 5.0mm · 0.57mm/px · 2 of 29 slices shown]
[im 1/29]
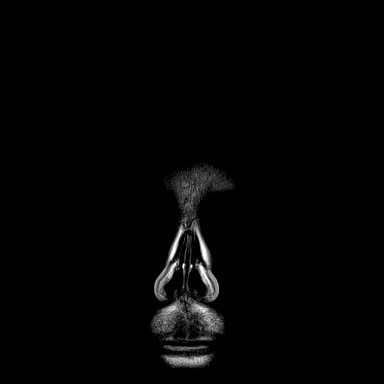
[im 29/29]
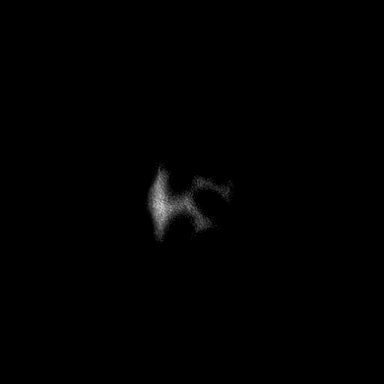

[Series 18: T1 post-contrast · axial · 1.0mm · 0.98mm/px · z∈[-112,+62]mm · 10 of 175 slices shown (1 of 2)]
[im 1/175]
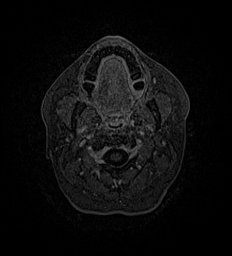
[im 20/175]
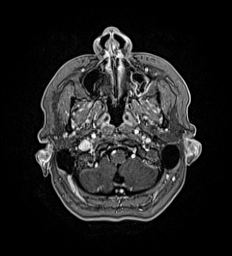
[im 39/175]
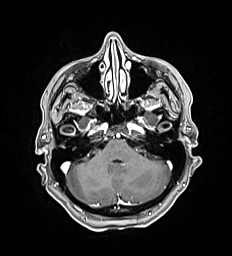
[im 59/175]
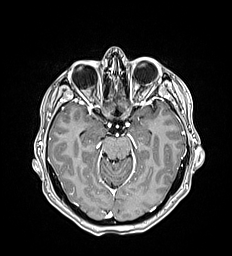
[im 78/175]
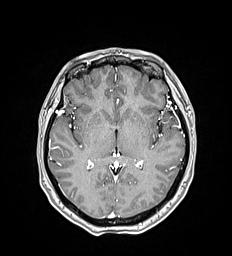
[im 97/175]
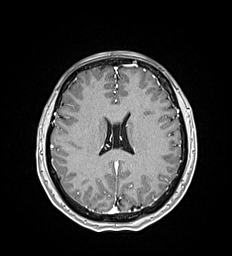
[im 117/175]
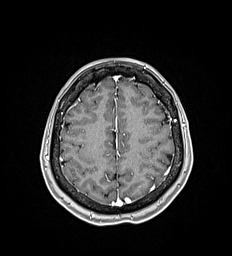
[im 136/175]
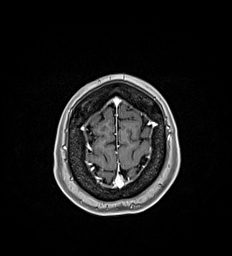
[im 155/175]
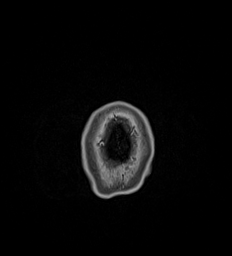
[im 175/175]
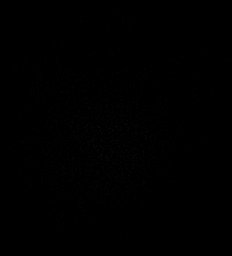

[Series 19: T1 post-contrast · coronal · 5.0mm · 0.57mm/px · 2 of 29 slices shown (2 of 2)]
[im 1/29]
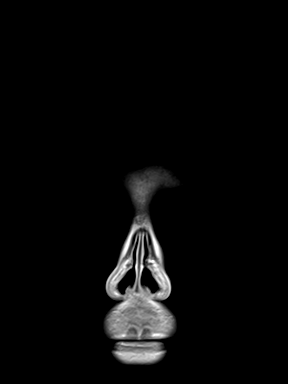
[im 29/29]
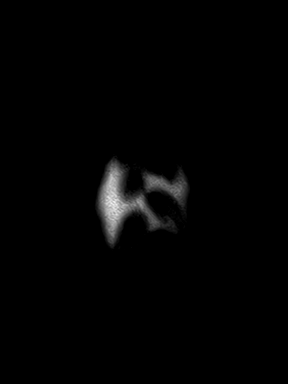

[48 of 48 positions shown; findings below may reference images not displayed]

FINDINGS: Brain: There is no acute infarction or intracranial hemorrhage.
There is no intracranial mass, mass effect, or edema. There is no
hydrocephalus or extra-axial fluid collection. Ventricles and sulci
are normal in size and configuration, noting incidental cavum septum
pellucidum et vergae. Minimal punctate foci of T2 hyperintensity in
the bifrontal subcortical white matter likely reflects nonspecific
gliosis/demyelination of doubtful clinical significance. No abnormal
enhancement.

Vascular: Major vessel flow voids at the skull base are preserved.

Skull and upper cervical spine: Normal marrow signal is praeserved.

Sinuses/Orbits: Mild mucosal thickening at the alveolar recess of
the left maxillary sinus is likely reactive to below. Orbits are
unremarkable.

Other: There is enhancement along the posterior left maxillary
alveolus with suspected dehiscence buccal cortex. There is also some
asymmetric enhancement of the adjacent soft tissue (series 18, image
13). Sella is partially empty. Mastoid air cells are clear.
IMPRESSION: No significant intracranial abnormality.

Suspect left posterior maxillary molar periodontal disease with
dehiscence of cortex and adjacent soft tissue inflammation. Dental
examination recommended. Maxillofacial CT could be considered.

These results will be called to the ordering clinician or
representative by the Radiologist Assistant, and communication
documented in the PACS or [REDACTED].

## 2021-09-11 ENCOUNTER — Ambulatory Visit: Payer: Self-pay | Admitting: *Deleted

## 2021-09-11 NOTE — Telephone Encounter (Signed)
Called with ear pain for "weeks".     Called patient from Triage que. Someone answered then hung up. Attempted to call again and left VM for visit options available to her.

## 2021-09-12 ENCOUNTER — Other Ambulatory Visit: Payer: Self-pay

## 2021-09-12 ENCOUNTER — Encounter: Payer: Self-pay | Admitting: Emergency Medicine

## 2021-09-12 ENCOUNTER — Ambulatory Visit
Admission: EM | Admit: 2021-09-12 | Discharge: 2021-09-12 | Disposition: A | Discharge status: Home / Self Care | Payer: Medicaid Other | Attending: Physician Assistant | Admitting: Physician Assistant

## 2021-09-12 DIAGNOSIS — H6692 Otitis media, unspecified, left ear: Secondary | ICD-10-CM | POA: Diagnosis not present

## 2021-09-12 MED ORDER — AMOXICILLIN 500 MG PO CAPS: 500.0000 mg | ORAL_CAPSULE | Freq: Three times a day (TID) | ORAL | 0 refills | Status: DC

## 2021-09-12 NOTE — ED Triage Notes (Signed)
Left ear pain since Saturday

## 2021-09-17 NOTE — ED Provider Notes (Signed)
[facility]    CSN: [id number] Arrival date & time: 09/12/21  1753      History   Chief Complaint No chief complaint on file.   HPI Shelly Williams is a 40 y.o. female.   The history is provided by the patient. No language interpreter was used.  Otalgia Location:  Left Quality:  Aching Severity:  Moderate Onset quality:  Gradual Duration:  4 days Progression:  Worsening Chronicity:  New Relieved by:  Nothing Worsened by:  Nothing Ineffective treatments:  None tried Associated symptoms: no vomiting    Past Medical History:  Diagnosis Date   Abdominal pain 2014   Depression    Gallstones 2014   History of kidney stones     Patient Active Problem List   Diagnosis Date Noted   Trigeminal neuralgia of left side of face 05/30/2021   Gastroesophageal reflux disease 05/30/2021   Insomnia 02/14/2021   Anemia 02/14/2021   Cough 08/30/2020   Cystocele, midline 02/15/2020   Urinary, incontinence, stress female 01/19/2020   Obesity 08/31/2019   Transient neurological symptoms 08/31/2019   Chronic pain of left knee 08/31/2019    Past Surgical History:  Procedure Laterality Date   BLADDER SUSPENSION N/A 06/28/2020   Procedure: PUBOVAGINAL SLING  PROCEDURE - TVT EXACT;  Surgeon: Gae Dry, MD;  Location: [facility];  Service: Gynecology;  Laterality: N/A;   CHOLECYSTECTOMY     CYSTOCELE REPAIR N/A 06/28/2020   Procedure: ANTERIOR REPAIR (CYSTOCELE);  Surgeon: Gae Dry, MD;  Location: [facility];  Service: Gynecology;  Laterality: N/A;   CYSTOSCOPY  06/28/2020   Procedure: CYSTOSCOPY;  Surgeon: Gae Dry, MD;  Location: [facility];  Service: Gynecology;;   TUBAL LIGATION      OB History     Gravida  4   Para  3   Term      Preterm      AB  1   Living  3      SAB  1   IAB      Ectopic      Multiple      Live Births           Obstetric Comments  1st Menstrual Cycle: 15 1st Pregnancy: 20          Home  Medications    Prior to Admission medications   Medication Sig Start Date End Date Taking? Authorizing Provider  amoxicillin (AMOXIL) 500 MG capsule Take 1 capsule (500 mg total) by mouth 3 (three) times daily. 09/12/21  Yes Caryl Ada K, PA-C  omeprazole (PRILOSEC) 20 MG capsule Take 1 capsule (20 mg total) by mouth daily. 05/30/21   Bacigalupo, Dionne Bucy, MD  traMADol (ULTRAM) 50 MG tablet Take 1 tablet (50 mg total) by mouth every 6 (six) hours as needed for moderate pain or severe pain. 05/30/21   Virginia Crews, MD  traZODone (DESYREL) 50 MG tablet TAKE 0.5-1 TABLETS BY MOUTH AT BEDTIME AS NEEDED FOR SLEEP. 03/08/21   Bacigalupo, Dionne Bucy, MD    Family History Family History  Problem Relation Age of Onset   Lung cancer Maternal Aunt        lung   Cancer Other        lung - maternal great aunt   Breast cancer Maternal Aunt        breast   Healthy Mother    Healthy Father    Healthy Sister    Healthy Brother  Healthy Daughter    Healthy Son    Healthy Daughter    Healthy Daughter    Colon cancer Neg Hx    Ovarian cancer Neg Hx     Social History Social History   Tobacco Use   Smoking status: Never   Smokeless tobacco: Never  Vaping Use   Vaping Use: Never used  Substance Use Topics   Alcohol use: No   Drug use: No     Allergies   Patient has no known allergies.   Review of Systems Review of Systems  HENT:  Positive for ear pain.   Gastrointestinal:  Negative for vomiting.  All other systems reviewed and are negative.   Physical Exam Triage Vital Signs ED Triage Vitals [09/12/21 1857]  Enc Vitals Group     BP 100/68     Pulse Rate 66     Resp 18     Temp 97.9 F (36.6 C)     Temp Source Oral     SpO2 100 %     Weight      Height      Head Circumference      Peak Flow      Pain Score 7     Pain Loc      Pain Edu?      Excl. in [place]?    No data found.  Updated Vital Signs BP 100/68 (BP Location: Right Arm)   Pulse 66   Temp 97.9 F  (36.6 C) (Oral)   Resp 18   LMP 09/01/2021 (Approximate)   SpO2 100%   Visual Acuity Right Eye Distance:   Left Eye Distance:   Bilateral Distance:    Right Eye Near:   Left Eye Near:    Bilateral Near:     Physical Exam Vitals and nursing note reviewed.  Constitutional:      Appearance: She is well-developed.  HENT:     Head: Normocephalic.     Ears:     Comments: Left tm erythematous bulging Pulmonary:     Effort: Pulmonary effort is normal.  Abdominal:     General: There is no distension.  Musculoskeletal:        General: Normal range of motion.     Cervical back: Normal range of motion.  Neurological:     Mental Status: She is alert and oriented to person, place, and time.     UC Treatments / Results  Labs (all labs ordered are listed, but only abnormal results are displayed) Labs Reviewed - No data to display  EKG   Radiology No results found.  Procedures Procedures (including critical care time)  Medications Ordered in UC Medications - No data to display  Initial Impression / Assessment and Plan / UC Course  I have reviewed the triage vital signs and the nursing notes.  Pertinent labs & imaging results that were available during my care of the patient were reviewed by me and considered in my medical decision making (see chart for details).      Final Clinical Impressions(s) / UC Diagnoses   Final diagnoses:  Left otitis media, unspecified otitis media type   Discharge Instructions   None    ED Prescriptions     Medication Sig Dispense Auth. Provider   amoxicillin (AMOXIL) 500 MG capsule Take 1 capsule (500 mg total) by mouth 3 (three) times daily. 30 capsule [place], [place]      PDMP not reviewed this encounter.   Caryl Ada  K, PA-C 09/17/21 1555

## 2021-10-26 ENCOUNTER — Ambulatory Visit: Payer: Self-pay | Admitting: *Deleted

## 2021-10-26 NOTE — Telephone Encounter (Signed)
Reason for Disposition  [1] MODERATE pain (e.g., interferes with normal activities) AND [2] pain comes and goes (cramps) AND [3] present > 24 hours  (Exception: pain with Vomiting or Diarrhea - see that Guideline)  Answer Assessment - Initial Assessment Questions 1. LOCATION: "Where does it hurt?"      Pt calling in c/o right sided abd pain on lower side.    If I sit forward the pain resolves but if I lean back it hurts.  I had pain all day Christmas Day for 3 days straight.   I was taking Tylenol which helped.   I also used Tramadol which helped.    The pain went away but now I'm having it again.   2. RADIATION: "Does the pain shoot anywhere else?" (e.g., chest, back)      3. ONSET: "When did the pain begin?" (e.g., minutes, hours or days ago)      Christmas Day and 3 straight days straight.   It went away and now the pain is back.    Started again last week.    Not at same time as menstrual cycle. 4. SUDDEN: "Gradual or sudden onset?"     *No Answer* 5. PATTERN "Does the pain come and go, or is it constant?"    - If constant: "Is it getting better, staying the same, or worsening?"      (Note: Constant means the pain never goes away completely; most serious pain is constant and it progresses)     - If intermittent: "How long does it last?" "Do you have pain now?"     (Note: Intermittent means the pain goes away completely between bouts)     *No Answer* 6. SEVERITY: "How bad is the pain?"  (e.g., Scale 1-10; mild, moderate, or severe)   - MILD (1-3): doesn't interfere with normal activities, abdomen soft and not tender to touch    - MODERATE (4-7): interferes with normal activities or awakens from sleep, abdomen tender to touch    - SEVERE (8-10): excruciating pain, doubled over, unable to do any normal activities      *No Answer* 7. RECURRENT SYMPTOM: "Have you ever had this type of stomach pain before?" If Yes, ask: "When was the last time?" and "What happened that time?"      *No  Answer* 8. CAUSE: "What do you think is causing the stomach pain?"     *No Answer* 9. RELIEVING/AGGRAVATING FACTORS: "What makes it better or worse?" (e.g., movement, antacids, bowel movement)     *No Answer* 10. OTHER SYMPTOMS: "Do you have any other symptoms?" (e.g., back pain, diarrhea, fever, urination pain, vomiting)       *No Answer* 11. PREGNANCY: "Is there any chance you are pregnant?" "When was your last menstrual period?"       *No Answer*  Protocols used: Abdominal Pain - [facility]

## 2021-10-26 NOTE — Telephone Encounter (Signed)
°  Chief Complaint: intermittent abd pain with intermittent lightheadedness Symptoms: abd pain for 3 straight days at Christmas.  Went away and now occurring again. Frequency: since Christmas Day.   Lightheaded intermittently for past 2 wks Pertinent Negatives: Patient denies any changes in pain with eating, menstrual cycles.  Disposition: [] ED /[] Urgent Care (no appt availability in office) / [x] Appointment(In office/virtual)/ []  [facility]/ [] Home Care/ [] Refused Recommended Disposition /[] [facility]/ []  Follow-up with PCP Additional Notes: Appt made with Dr. Caryn Section for Monday 10/30/2021.

## 2021-10-30 ENCOUNTER — Other Ambulatory Visit: Payer: Self-pay

## 2021-10-30 ENCOUNTER — Encounter: Payer: Self-pay | Admitting: Family Medicine

## 2021-10-30 ENCOUNTER — Ambulatory Visit (INDEPENDENT_AMBULATORY_CARE_PROVIDER_SITE_OTHER): Payer: Medicaid Other | Admitting: Family Medicine

## 2021-10-30 VITALS — BP 117/72 | HR 87 | Temp 98.6°F | Ht 62.0 in | Wt 194.5 lb

## 2021-10-30 DIAGNOSIS — R1030 Lower abdominal pain, unspecified: Secondary | ICD-10-CM

## 2021-10-30 DIAGNOSIS — Z23 Encounter for immunization: Secondary | ICD-10-CM | POA: Diagnosis not present

## 2021-10-30 DIAGNOSIS — B354 Tinea corporis: Secondary | ICD-10-CM

## 2021-10-30 DIAGNOSIS — R42 Dizziness and giddiness: Secondary | ICD-10-CM

## 2021-10-30 LAB — POCT URINALYSIS DIPSTICK
Bilirubin, UA: NEGATIVE
Blood, UA: NEGATIVE
Glucose, UA: NEGATIVE
Ketones, UA: NEGATIVE
Leukocytes, UA: NEGATIVE
Nitrite, UA: NEGATIVE
Protein, UA: NEGATIVE
Spec Grav, UA: <=1.005 — AB (ref 1.010–1.025)
Urobilinogen, UA: 0.2 E.U./dL
pH, UA: 5 (ref 5.0–8.0)

## 2021-10-30 MED ORDER — KETOCONAZOLE 2 % EX CREA: 1.0000 "application " | TOPICAL_CREAM | Freq: Every day | CUTANEOUS | 0 refills | Status: DC

## 2021-10-30 NOTE — Progress Notes (Signed)
I,Shelly Williams,acting as a Education administrator for Shelly Huh, MD.,have documented all relevant documentation on the behalf of Shelly Huh, MD,as directed by  Shelly Huh, MD while in the presence of Shelly Huh, MD.   Established patient visit   Patient: Shelly Williams   DOB: Nov 15, 1980   41 y.o. Female  MRN: [id number] Visit Date: 10/30/2021  Today's healthcare provider: [name], MD   No chief complaint on file.  Subjective    HPI Patient reports complaints of intermittent lower abd pain on right side She reports symptoms of abd pain for 3 straight days at Christmas that went away and now occurring again. Is not having any pain today. Patient denies any changes in pain with eating, menstrual cycles. Has taken tramadol for pain that was previously prescribed. Last menstrual January 2. Has woken up with pain in lower back once before and went away after one day. States when in fetal position the pain would not be as bad. Describes pain as a stabbing pain. No concerns with urination or bowel movements. She points to right suprapubic and inguinal area as the area that pain has been occurring.   -Patient also reports small brown spot above right breast that she has never noticed before no pain or redness but a little itching. Reports it has been there for 2 weeks. Has not used any creme or ointments just lotion  She also reports being lightheaded intermittently for past 2 wks. She states that when she would lean over she felt like the floor was moving back and forth. Had a slight spinning sensation at the time. Has now resolved.    Medications: Outpatient Medications Prior to Visit  Medication Sig   amoxicillin (AMOXIL) 500 MG capsule Take 1 capsule (500 mg total) by mouth 3 (three) times daily.   omeprazole (PRILOSEC) 20 MG capsule Take 1 capsule (20 mg total) by mouth daily.   traMADol (ULTRAM) 50 MG tablet Take 1 tablet (50 mg total) by mouth every 6 (six) hours as needed for  moderate pain or severe pain.   traZODone (DESYREL) 50 MG tablet TAKE 0.5-1 TABLETS BY MOUTH AT BEDTIME AS NEEDED FOR SLEEP.   No facility-administered medications prior to visit.    Review of Systems     Objective    BP 117/72 (BP Location: Left Arm, Patient Position: Sitting, Cuff Size: Normal)    Pulse 87    Temp 98.6 F (37 C) (Oral)    Ht 5\' 2"  (1.575 m)    Wt 194 lb 8 oz (88.2 kg)    SpO2 100%    BMI 35.57 kg/m  {Show previous vital signs (optional):23777}  Physical Exam   General: Appearance:    Mildly obese female in no acute distress  ENT:    Tms normal, canals clear.   Eyes:    PERRL, conjunctiva/corneas clear, EOM's intact       Lungs:     Clear to auscultation bilaterally, respirations unlabored  Heart:    Normal heart rate. Normal rhythm. No murmurs, rubs, or gallops.    MS:   All extremities are intact.    Skin:   About 1cm by 3/4 cm flat reddish brown patch with central clearing above right  breast.   Neurologic:   Awake, alert, oriented x 3. No apparent focal neurological defect. No nystagmus. Normal finger to nose and romberg.         Results for orders placed or performed in visit on 10/30/21  POCT  Urinalysis Dipstick  Result Value Ref Range   Color, UA     Clarity, UA     Glucose, UA Negative Negative   Bilirubin, UA negative    Ketones, UA negative    Spec Grav, UA <=1.005 (A) 1.010 - 1.025   Blood, UA negative    pH, UA 5.0 5.0 - 8.0   Protein, UA Negative Negative   Urobilinogen, UA 0.2 0.2 or 1.0 E.U./dL   Nitrite, UA negative    Leukocytes, UA Negative Negative   Appearance     Odor      Assessment & Plan     1. Lower abdominal pain  - US PELVIC COMPLETE WITH TRANSVAGINAL; Future  2. Dizziness  - CBC - Comprehensive metabolic panel Primarily associated with sensation of movement, although not spinning.   3. Tinea corporis  - ketoconazole (NIZORAL) 2 % cream; Apply 1 application topically daily.  Dispense: 15 g; Refill: 0          The entirety of the information documented in the History of Present Illness, Review of Systems and Physical Exam were personally obtained by me. Portions of this information were initially documented by the CMA and reviewed by me for thoroughness and accuracy.     Shelly Huh, MD  [facility] [phone] (phone) [phone] (fax)  [place]

## 2021-10-30 NOTE — Addendum Note (Signed)
Addended by: Birdie Sons on: 10/30/2021 11:58 AM   Modules accepted: Level of Service

## 2021-10-31 LAB — CBC
Hematocrit: 37.3 % (ref 34.0–46.6)
Hemoglobin: 12.1 g/dL (ref 11.1–15.9)
MCH: 26.3 pg — ABNORMAL LOW (ref 26.6–33.0)
MCHC: 32.4 g/dL (ref 31.5–35.7)
MCV: 81 fL (ref 79–97)
Platelets: 175 10*3/uL (ref 150–450)
RBC: 4.6 x10E6/uL (ref 3.77–5.28)
RDW: 15.3 % (ref 11.7–15.4)
WBC: 6.9 10*3/uL (ref 3.4–10.8)

## 2021-10-31 LAB — COMPREHENSIVE METABOLIC PANEL
ALT: 13 IU/L (ref 0–32)
AST: 15 IU/L (ref 0–40)
Albumin/Globulin Ratio: 1.9 (ref 1.2–2.2)
Albumin: 4.5 g/dL (ref 3.8–4.8)
Alkaline Phosphatase: 95 IU/L (ref 44–121)
BUN/Creatinine Ratio: 20 (ref 9–23)
BUN: 10 mg/dL (ref 6–24)
Bilirubin Total: 0.4 mg/dL (ref 0.0–1.2)
CO2: 20 mmol/L (ref 20–29)
Calcium: 9.3 mg/dL (ref 8.7–10.2)
Chloride: 108 mmol/L — ABNORMAL HIGH (ref 96–106)
Creatinine, Ser: 0.51 mg/dL — ABNORMAL LOW (ref 0.57–1.00)
Globulin, Total: 2.4 g/dL (ref 1.5–4.5)
Glucose: 94 mg/dL (ref 70–99)
Potassium: 4.9 mmol/L (ref 3.5–5.2)
Sodium: 141 mmol/L (ref 134–144)
Total Protein: 6.9 g/dL (ref 6.0–8.5)
eGFR: 121 mL/min/{1.73_m2} (ref >59)

## 2021-11-06 ENCOUNTER — Other Ambulatory Visit: Payer: Self-pay

## 2021-11-06 ENCOUNTER — Ambulatory Visit
Admission: RE | Admit: 2021-11-06 | Discharge: 2021-11-06 | Disposition: A | Discharge status: Home / Self Care | Payer: 59 | Source: Ambulatory Visit | Attending: Family Medicine | Admitting: Family Medicine

## 2021-11-06 DIAGNOSIS — R1030 Lower abdominal pain, unspecified: Secondary | ICD-10-CM | POA: Diagnosis not present

## 2021-11-08 ENCOUNTER — Telehealth: Payer: Self-pay

## 2021-11-08 DIAGNOSIS — D259 Leiomyoma of uterus, unspecified: Secondary | ICD-10-CM

## 2021-11-08 NOTE — Telephone Encounter (Addendum)
Patient called back to inform PCP she would like to be referred to [facility]  [address] [place], [place] [address], [phone]

## 2021-11-08 NOTE — Telephone Encounter (Signed)
-----   Message from Birdie Sons, MD sent at 11/07/2021  3:49 PM EST ----- Ultrasound shows a small uterine fibroid that may be causing her symptoms. Recommend referral to gyn for further evaluation.

## 2021-11-08 NOTE — Telephone Encounter (Signed)
Patient advised. See separate phone message.

## 2021-11-08 NOTE — Addendum Note (Signed)
Addended by: Randal Buba on: 11/08/2021 03:31 PM   Modules accepted: Orders

## 2021-11-08 NOTE — Telephone Encounter (Signed)
Referral order placed. Please schedule appt.  

## 2021-11-08 NOTE — Telephone Encounter (Signed)
Copied from [place] [phone]. Topic: General - Call Back - No Documentation >> Nov 08, 2021 10:49 AM Scherrie Gerlach wrote: Reason for CRM: pt would like call back about [place] results.  Dr Caryn Section has resulted.

## 2021-11-08 NOTE — Telephone Encounter (Signed)
Patient advised and agrees to referral. She has a GYN that she would like the referral sent to, but she was unable to remember the doctors name. Patient states she is going to call back with the name of the GYN that she wants the referral sent to.

## 2022-01-09 ENCOUNTER — Ambulatory Visit (INDEPENDENT_AMBULATORY_CARE_PROVIDER_SITE_OTHER): Payer: 59 | Admitting: Obstetrics and Gynecology

## 2022-01-09 ENCOUNTER — Encounter: Payer: Self-pay | Admitting: Obstetrics and Gynecology

## 2022-01-09 DIAGNOSIS — D259 Leiomyoma of uterus, unspecified: Secondary | ICD-10-CM

## 2022-01-09 DIAGNOSIS — Z7689 Persons encountering health services in other specified circumstances: Secondary | ICD-10-CM | POA: Diagnosis not present

## 2022-01-09 NOTE — Progress Notes (Signed)
HPI: ?     [name] is a 41 y.o. [id number] who LMP was No LMP recorded. ? ?Subjective:  ? ?She presents today because she has been having occasional back pain, occasional pelvic pressure, and some pain with sex.  She says that lubrication helps with sex but not "all the time".  She has a tubal ligation for birth control.  She says her menses last for 2 to 3 days that are light them she has 3 days off and she has 7 days of heavy vaginal bleeding like a normal period. ?She recently had an ultrasound showing a small uterine fibroid.  No one has spoken to her about fibroids. ? ?  Hx: ?The following portions of the patient's history were reviewed and updated as appropriate: ?            She  has a past medical history of Abdominal pain (2014), Depression, Gallstones (2014), and History of kidney stones. ?She does not have any pertinent problems on file. ?She  has a past surgical history that includes Cholecystectomy; Tubal ligation; Cystocele repair (N/A, 06/28/2020); Bladder suspension (N/A, 06/28/2020); and Cystoscopy (06/28/2020). ?Her family history includes Breast cancer in her maternal aunt; Cancer in an other family member; Healthy in her brother, daughter, daughter, daughter, father, mother, sister, and son; Lung cancer in her maternal aunt. ?She  reports that she has never smoked. She has never used smokeless tobacco. She reports that she does not drink alcohol and does not use drugs. ?She currently has no medications in their medication list. ?She has No Known Allergies. ?      ?Review of Systems:  ?Review of Systems ? ?Constitutional: Denied constitutional symptoms, night sweats, recent illness, fatigue, fever, insomnia and weight loss.  ?Eyes: Denied eye symptoms, eye pain, photophobia, vision change and visual disturbance.  ?Ears/Nose/Throat/Neck: Denied ear, nose, throat or neck symptoms, hearing loss, nasal discharge, sinus congestion and sore throat.  ?Cardiovascular: Denied cardiovascular symptoms,  arrhythmia, chest pain/pressure, edema, exercise intolerance, orthopnea and palpitations.  ?Respiratory: Denied pulmonary symptoms, asthma, pleuritic pain, productive sputum, cough, dyspnea and wheezing.  ?Gastrointestinal: Denied, gastro-esophageal reflux, melena, nausea and vomiting.  ?Genitourinary: See HPI for additional information.  ?Musculoskeletal: Denied musculoskeletal symptoms, stiffness, swelling, muscle weakness and myalgia.  ?Dermatologic: Denied dermatology symptoms, rash and scar.  ?Neurologic: Denied neurology symptoms, dizziness, headache, neck pain and syncope.  ?Psychiatric: Denied psychiatric symptoms, anxiety and depression.  ?Endocrine: Denied endocrine symptoms including hot flashes and night sweats.  ? ?Meds: ?  ?No current outpatient medications on file prior to visit.  ? ?No current facility-administered medications on file prior to visit.  ? ? ? ? ?Objective:  ?  ? ?There were no vitals filed for this visit. ?There were no vitals filed for this visit. ?  ?         Ultrasound results reviewed directly with the patient ?        ? ?Assessment:  ?  ?[id number] ?Patient Active Problem List  ? Diagnosis Date Noted  ? Trigeminal neuralgia of left side of face 05/30/2021  ? Gastroesophageal reflux disease 05/30/2021  ? Insomnia 02/14/2021  ? Anemia 02/14/2021  ? Cough 08/30/2020  ? Cystocele, midline 02/15/2020  ? Urinary, incontinence, stress female 01/19/2020  ? Obesity 08/31/2019  ? Transient neurological symptoms 08/31/2019  ? Chronic pain of left knee 08/31/2019  ? ?  ?1. Establishing care with new doctor, encounter for   ?2. Uterine leiomyoma, unspecified location   ? ? Small uterine  fibroid possibly part of her cramping and bleeding with menses issues.  Possibly causing dyspareunia.  Doubt back pain doubt significant source of pelvic pressure. ? ? ?Plan:  ?  ?       ? 1.  We have discussed her normal ultrasound in detail.  Uterine fibroids have been discussed the natural course and history of  fibroids have been reviewed.  There are occasional causing of heavier vaginal bleeding, irregular vaginal bleeding and cramping discussed.  All questions answered. ? 2.  Methods of cycle regulation discussed in detail.  Patient is very interested in having shorter core no menstrual periods.  This will also be a good test to see if any of her pelvic pain or back pain symptoms resolve with her menstrual period issues. ?We discussed OCPs Depo and other hormonal manipulations of cycle.  Patient is most interested in IUD.  She will return with her menstrual period for an IUD. ?Orders ?No orders of the defined types were placed in this encounter. ? ? No orders of the defined types were placed in this encounter. ?  ?  F/U ? Return in about 1 week (around 01/16/2022). ? ?I spent 31 minutes involved in the care of this patient preparing to see the patient by obtaining and reviewing her medical history (including labs, imaging tests and prior procedures), documenting clinical information in the electronic health record (EHR), counseling and coordinating care plans, writing and sending prescriptions, ordering tests or procedures and in direct communicating with the patient and medical staff discussing pertinent items from her history and physical exam. ? ?Finis Bud, M.D. ?01/09/2022 ?3:14 PM ? ? ? ? ?

## 2022-01-09 NOTE — Progress Notes (Signed)
Patient presents today to discuss ultrasound results and fibroids. She states she has not been given her ultrasound results from previous provider.She states she has been experiencing pressure that comes and goes starting in November 2022. Patient states irregular periods that consist of heavy bleeding and moderate cramping. Patient states no other questions at this time. ?

## 2022-01-13 ENCOUNTER — Ambulatory Visit
Admission: EM | Admit: 2022-01-13 | Discharge: 2022-01-13 | Disposition: A | Discharge status: Home / Self Care | Payer: Medicaid Other | Attending: Family Medicine | Admitting: Family Medicine

## 2022-01-13 DIAGNOSIS — R600 Localized edema: Secondary | ICD-10-CM | POA: Diagnosis not present

## 2022-01-13 DIAGNOSIS — K130 Diseases of lips: Secondary | ICD-10-CM

## 2022-01-13 MED ORDER — PREDNISONE 50 MG PO TABS: ORAL_TABLET | ORAL | 0 refills | Status: DC

## 2022-01-13 MED ORDER — CEPHALEXIN 500 MG PO CAPS: 500.0000 mg | ORAL_CAPSULE | Freq: Two times a day (BID) | ORAL | 0 refills | Status: DC

## 2022-01-13 NOTE — ED Triage Notes (Signed)
Pt states she went to get her lips tattooed on Wednesday ? ?Pt states she experienced a little swelling with the bottom lip yesterday and today both lips are swollen with small bumps around her mouth ? ?Pt states she took Ibuprofen without any relief ? ?Denies Fever ?

## 2022-01-13 NOTE — Discharge Instructions (Signed)
Keep lips coated in Aquaphor or Vaseline at all times until completely healed.  Follow-up right away if worsening at any time. ?

## 2022-01-17 NOTE — ED Provider Notes (Signed)
?[facility] ? ? ? ?CSN: [id number] ?Arrival date & time: 01/13/22  1154 ? ? ?  ? ?History   ?Chief Complaint ?Chief Complaint  ?Patient presents with  ? lip infection  ? ? ?HPI ?Shelly Williams is a 41 y.o. female.  ? ?Presenting today with several day history of upper and lower lip swelling, peeling, itching and irritation and now crusting and pustules forming after getting her lips tattooed on Wednesday.  She denies fever, chills, drainage, throat itching or swelling, difficulty breathing or swallowing.  Has been taking ibuprofen with no relief. ? ? ?Past Medical History:  ?Diagnosis Date  ? Abdominal pain 2014  ? Depression   ? Gallstones 2014  ? History of kidney stones   ? ? ?Patient Active Problem List  ? Diagnosis Date Noted  ? Trigeminal neuralgia of left side of face 05/30/2021  ? Gastroesophageal reflux disease 05/30/2021  ? Insomnia 02/14/2021  ? Anemia 02/14/2021  ? Cough 08/30/2020  ? Cystocele, midline 02/15/2020  ? Urinary, incontinence, stress female 01/19/2020  ? Obesity 08/31/2019  ? Transient neurological symptoms 08/31/2019  ? Chronic pain of left knee 08/31/2019  ? ? ?Past Surgical History:  ?Procedure Laterality Date  ? BLADDER SUSPENSION N/A 06/28/2020  ? Procedure: PUBOVAGINAL SLING  PROCEDURE - TVT EXACT;  Surgeon: Gae Dry, MD;  Location: [facility] ORS;  Service: Gynecology;  Laterality: N/A;  ? CHOLECYSTECTOMY    ? CYSTOCELE REPAIR N/A 06/28/2020  ? Procedure: ANTERIOR REPAIR (CYSTOCELE);  Surgeon: Gae Dry, MD;  Location: [facility] ORS;  Service: Gynecology;  Laterality: N/A;  ? CYSTOSCOPY  06/28/2020  ? Procedure: CYSTOSCOPY;  Surgeon: Gae Dry, MD;  Location: [facility] ORS;  Service: Gynecology;;  ? TUBAL LIGATION    ? ? ?OB History   ? ? Gravida  ?6  ? Para  ?4  ? Term  ?4  ? Preterm  ?   ? AB  ?2  ? Living  ?4  ?  ? ? SAB  ?2  ? IAB  ?   ? Ectopic  ?   ? Multiple  ?   ? Live Births  ?4  ?   ?  ? Obstetric Comments  ?1st Menstrual Cycle: 15 ?1st Pregnancy: 20  ?  ? ?   ? ? ? ?Home Medications   ? ?Prior to Admission medications   ?Medication Sig Start Date End Date Taking? Authorizing Provider  ?cephALEXin (KEFLEX) 500 MG capsule Take 1 capsule (500 mg total) by mouth 2 (two) times daily. 01/13/22  Yes Volney American, PA-C  ?predniSONE (DELTASONE) 50 MG tablet Take 1 tab daily with breakfast x 3 days 01/13/22  Yes Volney American, PA-C  ? ? ?Family History ?Family History  ?Problem Relation Age of Onset  ? Lung cancer Maternal Aunt   ?     lung  ? Cancer Other   ?     lung - maternal great aunt  ? Breast cancer Maternal Aunt   ?     breast  ? Healthy Mother   ? Healthy Father   ? Healthy Sister   ? Healthy Brother   ? Healthy Daughter   ? Healthy Son   ? Healthy Daughter   ? Healthy Daughter   ? Colon cancer Neg Hx   ? Ovarian cancer Neg Hx   ? ? ?Social History ?Social History  ? ?Tobacco Use  ? Smoking status: Never  ? Smokeless tobacco: Never  ?Vaping Use  ?  Vaping Use: Never used  ?Substance Use Topics  ? Alcohol use: No  ? Drug use: No  ? ? ? ?Allergies   ?Patient has no known allergies. ? ? ?Review of Systems ?Review of Systems ?Per HPI ? ?Physical Exam ?Triage Vital Signs ?ED Triage Vitals  ?Enc Vitals Group  ?   BP 01/13/22 1411 107/75  ?   Pulse Rate 01/13/22 1411 90  ?   Resp 01/13/22 1411 18  ?   Temp 01/13/22 1411 98.7 ?F (37.1 ?C)  ?   Temp Source 01/13/22 1411 Oral  ?   SpO2 01/13/22 1411 97 %  ?   Weight --   ?   Height --   ?   Head Circumference --   ?   Peak Flow --   ?   Pain Score 01/13/22 1409 5  ?   Pain Loc --   ?   Pain Edu? --   ?   Excl. in [place]? --   ? ?No data found. ? ?Updated Vital Signs ?BP 107/75 (BP Location: Right Arm)   Pulse 90   Temp 98.7 ?F (37.1 ?C) (Oral)   Resp 18   LMP 12/07/2021 (Exact Date)   SpO2 97%  ? ?Visual Acuity ?Right Eye Distance:   ?Left Eye Distance:   ?Bilateral Distance:   ? ?Right Eye Near:   ?Left Eye Near:    ?Bilateral Near:    ? ?Physical Exam ?Vitals and nursing note reviewed.  ?Constitutional:   ?    Appearance: Normal appearance. She is not ill-appearing.  ?HENT:  ?   Head: Atraumatic.  ?   Mouth/Throat:  ?   Mouth: Mucous membranes are moist.  ?   Comments: Upper and lower lips peeling, mildly edematous and erythematous with crusting and pustules toward the outer borders ?Eyes:  ?   Extraocular Movements: Extraocular movements intact.  ?   Conjunctiva/sclera: Conjunctivae normal.  ?Cardiovascular:  ?   Rate and Rhythm: Normal rate and regular rhythm.  ?   Heart sounds: Normal heart sounds.  ?Pulmonary:  ?   Effort: Pulmonary effort is normal.  ?   Breath sounds: Normal breath sounds.  ?Musculoskeletal:     ?   General: Normal range of motion.  ?   Cervical back: Normal range of motion and neck supple.  ?Skin: ?   General: Skin is warm and dry.  ?Neurological:  ?   Mental Status: She is alert and oriented to person, place, and time.  ?   Motor: No weakness.  ?   Gait: Gait normal.  ?Psychiatric:     ?   Mood and Affect: Mood normal.     ?   Thought Content: Thought content normal.     ?   Judgment: Judgment normal.  ? ? ? ?UC Treatments / Results  ?Labs ?(all labs ordered are listed, but only abnormal results are displayed) ?Labs Reviewed - No data to display ? ?EKG ? ? ?Radiology ?No results found. ? ?Procedures ?Procedures (including critical care time) ? ?Medications Ordered in UC ?Medications - No data to display ? ?Initial Impression / Assessment and Plan / UC Course  ?I have reviewed the triage vital signs and the nursing notes. ? ?Pertinent labs & imaging results that were available during my care of the patient were reviewed by me and considered in my medical decision making (see chart for details). ? ?  ? ?Appears to have an infection after tattooing of the lips.  Treat with Keflex, prednisone for the edema and keep lips covered with Aquaphor at all times.  Strict return precautions given for any acutely worsening symptoms.  States up-to-date on tetanus shot. ? ?Final Clinical Impressions(s) / UC  Diagnoses  ? ?Final diagnoses:  ?Lip edema  ?Cellulitis of lip  ? ? ? ?Discharge Instructions   ? ?  ?Keep lips coated in Aquaphor or Vaseline at all times until completely healed.  Follow-up right away if worsening at any time. ? ? ? ?ED Prescriptions   ? ? Medication Sig Dispense Auth. Provider  ? cephALEXin (KEFLEX) 500 MG capsule Take 1 capsule (500 mg total) by mouth 2 (two) times daily. 14 capsule Volney American, [place]  ? predniSONE (DELTASONE) 50 MG tablet Take 1 tab daily with breakfast x 3 days 3 tablet Volney American, [place]  ? ?  ? ?PDMP not reviewed this encounter. ?  ?Volney American, PA-C ?01/17/22 1852 ? ?

## 2022-01-18 ENCOUNTER — Encounter: Payer: Medicaid Other | Admitting: Obstetrics and Gynecology

## 2022-01-24 ENCOUNTER — Encounter: Payer: Self-pay | Admitting: Obstetrics and Gynecology

## 2022-01-24 ENCOUNTER — Ambulatory Visit (INDEPENDENT_AMBULATORY_CARE_PROVIDER_SITE_OTHER): Payer: 59 | Admitting: Obstetrics and Gynecology

## 2022-01-24 VITALS — BP 107/65 | HR 86 | Ht 62.0 in | Wt 190.2 lb

## 2022-01-24 DIAGNOSIS — Z30430 Encounter for insertion of intrauterine contraceptive device: Secondary | ICD-10-CM

## 2022-01-24 NOTE — Progress Notes (Signed)
Patient presents today for IUD insertion. Mirena consent form signed. Patient states no questions or concerns at this time.  ?

## 2022-01-25 NOTE — Progress Notes (Signed)
HPI: ?     Shelly Williams is a 41 y.o. [id number] who LMP was Patient's last menstrual period was 01/22/2022 (exact date). ? ?Subjective:  ? ?She presents today for IUD insertion.  She is getting an IUD for cycle control because she has irregular long and often heavy menses. ? ?  Hx: ?The following portions of the patient's history were reviewed and updated as appropriate: ?            She  has a past medical history of Abdominal pain (2014), Depression, Gallstones (2014), and History of kidney stones. ?She does not have any pertinent problems on file. ?She  has a past surgical history that includes Cholecystectomy; Tubal ligation; Cystocele repair (N/A, 06/28/2020); Bladder suspension (N/A, 06/28/2020); and Cystoscopy (06/28/2020). ?Her family history includes Breast cancer in her maternal aunt; Cancer in an other family member; Healthy in her brother, daughter, daughter, daughter, father, mother, sister, and son; Lung cancer in her maternal aunt. ?She  reports that she has never smoked. She has never used smokeless tobacco. She reports that she does not drink alcohol and does not use drugs. ?She currently has no medications in their medication list. ?She has No Known Allergies. ?      ?Review of Systems:  ?Review of Systems ? ?Constitutional: Denied constitutional symptoms, night sweats, recent illness, fatigue, fever, insomnia and weight loss.  ?Eyes: Denied eye symptoms, eye pain, photophobia, vision change and visual disturbance.  ?Ears/Nose/Throat/Neck: Denied ear, nose, throat or neck symptoms, hearing loss, nasal discharge, sinus congestion and sore throat.  ?Cardiovascular: Denied cardiovascular symptoms, arrhythmia, chest pain/pressure, edema, exercise intolerance, orthopnea and palpitations.  ?Respiratory: Denied pulmonary symptoms, asthma, pleuritic pain, productive sputum, cough, dyspnea and wheezing.  ?Gastrointestinal: Denied, gastro-esophageal reflux, melena, nausea and vomiting.  ?Genitourinary: See  HPI for additional information.  ?Musculoskeletal: Denied musculoskeletal symptoms, stiffness, swelling, muscle weakness and myalgia.  ?Dermatologic: Denied dermatology symptoms, rash and scar.  ?Neurologic: Denied neurology symptoms, dizziness, headache, neck pain and syncope.  ?Psychiatric: Denied psychiatric symptoms, anxiety and depression.  ?Endocrine: Denied endocrine symptoms including hot flashes and night sweats.  ? ?Meds: ?  ?No current outpatient medications on file prior to visit.  ? ?No current facility-administered medications on file prior to visit.  ? ? ?Objective:  ?  ? ?Vitals:  ? 01/24/22 1542  ?BP: 107/65  ?Pulse: 86  ? ? Physical examination ?  Pelvic:   ?Vulva: Normal appearance.  No lesions.  ?Vagina: No lesions or abnormalities noted.  ?Support: Normal pelvic support.  ?Urethra No masses tenderness or scarring.  ?Meatus Normal size without lesions or prolapse.  ?Cervix: Normal appearance.  No lesions.  ?Anus: Normal exam.  No lesions.  ?Perineum: Normal exam.  No lesions.  ?      Bimanual   ?Uterus: Normal size.  Non-tender.  Mobile.  AV.  ?Adnexae: No masses.  Non-tender to palpation.  ?Cul-de-sac: Negative for abnormality.  ? ?IUD Procedure ?Pt has read the booklet and signed the appropriate forms regarding the Mirena IUD.  All of her questions have been answered.   ?The cervix was cleansed with betadine solution.  After sounding the uterus and noting the position, the IUD was placed in the usual manner without problem.  The string was cut to the appropriate length.  The patient tolerated the procedure well. ?  ?         [place] # = [id number] ? ? ?Assessment:  ?  ?[id number] ?Patient Active Problem List  ? Diagnosis  Date Noted  ? Trigeminal neuralgia of left side of face 05/30/2021  ? Gastroesophageal reflux disease 05/30/2021  ? Insomnia 02/14/2021  ? Anemia 02/14/2021  ? Cough 08/30/2020  ? Cystocele, midline 02/15/2020  ? Urinary, incontinence, stress female 01/19/2020  ? Obesity 08/31/2019   ? Transient neurological symptoms 08/31/2019  ? Chronic pain of left knee 08/31/2019  ? ?  ?1. Encounter for IUD insertion   ? ?  ? ?Plan:  ?  ?       ?  F/U ? Return in about 4 weeks (around 02/21/2022) for For IUD f/u. ? ?Finis Bud, M.D. ?01/25/2022 ?8:09 AM ? ?

## 2022-02-14 NOTE — Progress Notes (Deleted)
Complete physical exam   Patient: Shelly Williams   DOB: 1981-06-12   41 y.o. Female  MRN: [id number] Visit Date: 02/16/2022  Today's healthcare provider: Lavon Paganini, MD   No chief complaint on file.  Subjective    Shelly Williams is a 41 y.o. female who presents today for a complete physical exam.  She reports consuming a {diet types:17450} diet. {Exercise:19826} She generally feels {well/fairly well/poorly:18703}. She reports sleeping {well/fairly well/poorly:18703}. She {does/does not:200015} have additional problems to discuss today.  HPI    Past Medical History:  Diagnosis Date   Abdominal pain 2014   Depression    Gallstones 2014   History of kidney stones    Past Surgical History:  Procedure Laterality Date   BLADDER SUSPENSION N/A 06/28/2020   Procedure: PUBOVAGINAL SLING  PROCEDURE - TVT EXACT;  Surgeon: Gae Dry, MD;  Location: [facility] OR[facility];  Service: Gynecology;  Laterality: N/A;   CHOLECYSTECTOMY     CYSTOCELE REPAIR N/A 06/28/2020   Procedure: ANTERIOR REPAIR (CYSTOCELE);  Surgeon: Gae Dry, MD;  Location: [facility];  Service: Gynecology;  Laterality: N/A;   CYSTOSCOPY  06/28/2020   Procedure: CYSTOSCOPY;  Surgeon: Gae Dry, MD;  Location: [facility];  Service: Gynecology;;   TUBAL LIGATION     Social History   Socioeconomic History   Marital status: Married    Spouse name: Not on file   Number of children: 4   Years of education: Not on file   Highest education level: Not on file  Occupational History   Not on file  Tobacco Use   Smoking status: Never   Smokeless tobacco: Never  Vaping Use   Vaping Use: Never used  Substance and Sexual Activity   Alcohol use: No   Drug use: No   Sexual activity: Yes    Partners: Male    Birth control/protection: Surgical  Other Topics Concern   Not on file  Social History Narrative   Not on file   Social Determinants of Health   Financial Resource Strain: Not on file  Food  Insecurity: Not on file  Transportation Needs: Not on file  Physical Activity: Not on file  Stress: Not on file  Social Connections: Not on file  Intimate Partner Violence: Not on file   Family Status  Relation Name Status   Mat Aunt  Deceased   Other  (Not Specified)   Mat Aunt  Deceased   Mother  Alive   Father  Alive   Sister  [place]   Daughter  Alive   Son  Alive   Daughter  Alive   Daughter  Alive   Neg Hx  (Not Specified)   Family History  Problem Relation Age of Onset   Lung cancer Maternal Aunt        lung   Cancer Other        lung - maternal great aunt   Breast cancer Maternal Aunt        breast   Healthy Mother    Healthy Father    Healthy Sister    Healthy Brother    Healthy Daughter    Healthy Son    Healthy Daughter    Healthy Daughter    Colon cancer Neg Hx    Ovarian cancer Neg Hx    No Known Allergies  Patient Care Team: Bacigalupo, Dionne Bucy, MD as PCP - General (Family Medicine) Christene Lye, MD (General Surgery)  Christene Lye, MD (General Surgery) Luna Glasgow, DO (Family Medicine)   Medications: No outpatient medications prior to visit.   No facility-administered medications prior to visit.    Review of Systems  All other systems reviewed and are negative.  Last CBC Lab Results  Component Value Date   WBC 6.9 10/30/2021   HGB 12.1 10/30/2021   HCT 37.3 10/30/2021   MCV 81 10/30/2021   MCH 26.3 (L) 10/30/2021   RDW 15.3 10/30/2021   PLT 175 19/62/2297   Last metabolic panel Lab Results  Component Value Date   GLUCOSE 94 10/30/2021   NA 141 10/30/2021   K 4.9 10/30/2021   CL 108 (H) 10/30/2021   CO2 20 10/30/2021   BUN 10 10/30/2021   CREATININE 0.51 (L) 10/30/2021   EGFR 121 10/30/2021   CALCIUM 9.3 10/30/2021   PROT 6.9 10/30/2021   ALBUMIN 4.5 10/30/2021   LABGLOB 2.4 10/30/2021   AGRATIO 1.9 10/30/2021   BILITOT 0.4 10/30/2021   ALKPHOS 95 10/30/2021   AST 15 10/30/2021    ALT 13 10/30/2021   ANIONGAP 7 12/08/2012   Last lipids Lab Results  Component Value Date   CHOL 159 02/14/2021   HDL 49 02/14/2021   LDLCALC 97 02/14/2021   TRIG 69 02/14/2021   CHOLHDL 3.2 02/14/2021   Last hemoglobin A1c No results found for: HGBA1C Last thyroid functions Lab Results  Component Value Date   TSH 1.600 01/28/2020   Last vitamin D No results found for: 25OHVITD2, 25OHVITD3, VD25OH Last vitamin B12 and Folate No results found for: VITAMINB12, FOLATE    Objective    LMP 01/22/2022 (Exact Date)  BP Readings from Last 3 Encounters:  01/24/22 107/65  01/13/22 107/75  10/30/21 117/72   Wt Readings from Last 3 Encounters:  01/24/22 190 lb 3.2 oz (86.3 kg)  10/30/21 194 lb 8 oz (88.2 kg)  05/30/21 186 lb 11.2 oz (84.7 kg)       Physical Exam  ***  Last depression screening scores    10/30/2021   10:47 AM 05/30/2021   10:56 AM 02/14/2021   10:54 AM  PHQ 2/9 Scores  PHQ - 2 Score 0 4 2  PHQ- 9 Score 5 8 7    Last fall risk screening    10/30/2021   10:47 AM  Fall Risk   Falls in the past year? 0  Number falls in past yr: 0  Injury with Fall? 0  Risk for fall due to : No Fall Risks   Last Audit-C alcohol use screening    10/30/2021   10:47 AM  Alcohol Use Disorder Test (AUDIT)  1. How often do you have a drink containing alcohol? 1  2. How many drinks containing alcohol do you have on a typical day when you are drinking? 0  3. How often do you have six or more drinks on one occasion? 0  AUDIT-C Score 1   A score of 3 or more in women, and 4 or more in men indicates increased risk for alcohol abuse, EXCEPT if all of the points are from question 1   No results found for any visits on 02/16/22.  Assessment & Plan    Routine Health Maintenance and Physical Exam  Exercise Activities and Dietary recommendations  Goals   None     Immunization History  Administered Date(s) Administered   Influenza,inj,Quad PF,6+ Mos 08/31/2019,  08/18/2020, 10/30/2021   Janssen (J&J) SARS-COV-2 Vaccination 12/19/2019, 01/07/2020   PFIZER(Purple Top)SARS-COV-2  Vaccination 08/04/2020   Tdap 03/10/2014    Health Maintenance  Topic Date Due   Hepatitis C Screening  Never done   COVID-19 Vaccine (4 - Booster for Janssen series) 09/29/2020   INFLUENZA VACCINE  05/08/2022   TETANUS/TDAP  03/10/2024   PAP SMEAR-Modifier  01/17/2025   HIV Screening  Completed   HPV VACCINES  Aged Out    Discussed health benefits of physical activity, and encouraged her to engage in regular exercise appropriate for her age and condition.  ***  No follow-ups on file.     {provider attestation***:1}   Lavon Paganini, MD  [facility] [phone] (phone) [phone] (fax)  [place]

## 2022-02-16 ENCOUNTER — Encounter: Payer: Medicaid Other | Admitting: Family Medicine

## 2022-02-16 DIAGNOSIS — Z0000 Encounter for general adult medical examination without abnormal findings: Secondary | ICD-10-CM

## 2022-02-21 ENCOUNTER — Ambulatory Visit (INDEPENDENT_AMBULATORY_CARE_PROVIDER_SITE_OTHER): Payer: 59 | Admitting: Obstetrics and Gynecology

## 2022-02-21 ENCOUNTER — Encounter: Payer: Self-pay | Admitting: Obstetrics and Gynecology

## 2022-02-21 VITALS — BP 102/69 | HR 90 | Ht 62.0 in | Wt 196.9 lb

## 2022-02-21 DIAGNOSIS — Z30431 Encounter for routine checking of intrauterine contraceptive device: Secondary | ICD-10-CM

## 2022-02-21 NOTE — Progress Notes (Signed)
HPI: ?     [name] is a 41 y.o. [id number] who LMP was Patient's last menstrual period was 02/14/2022 (exact date). ? ?Subjective:  ? ?She presents today 4 weeks after insertion of IUD.  She got the IUD for menorrhagia.  She is having her period today. ?She reports no issues with intercourse. ? ?  Hx: ?The following portions of the patient's history were reviewed and updated as appropriate: ?            She  has a past medical history of Abdominal pain (2014), Depression, Gallstones (2014), and History of kidney stones. ?She does not have any pertinent problems on file. ?She  has a past surgical history that includes Cholecystectomy; Tubal ligation; Cystocele repair (N/A, 06/28/2020); Bladder suspension (N/A, 06/28/2020); and Cystoscopy (06/28/2020). ?Her family history includes Breast cancer in her maternal aunt; Cancer in an other family member; Healthy in her brother, daughter, daughter, daughter, father, mother, sister, and son; Lung cancer in her maternal aunt. ?She  reports that she has never smoked. She has never used smokeless tobacco. She reports that she does not drink alcohol and does not use drugs. ?She currently has no medications in their medication list. ?She has No Known Allergies. ?      ?Review of Systems:  ?Review of Systems ? ?Constitutional: Denied constitutional symptoms, night sweats, recent illness, fatigue, fever, insomnia and weight loss.  ?Eyes: Denied eye symptoms, eye pain, photophobia, vision change and visual disturbance.  ?Ears/Nose/Throat/Neck: Denied ear, nose, throat or neck symptoms, hearing loss, nasal discharge, sinus congestion and sore throat.  ?Cardiovascular: Denied cardiovascular symptoms, arrhythmia, chest pain/pressure, edema, exercise intolerance, orthopnea and palpitations.  ?Respiratory: Denied pulmonary symptoms, asthma, pleuritic pain, productive sputum, cough, dyspnea and wheezing.  ?Gastrointestinal: Denied, gastro-esophageal reflux, melena, nausea and  vomiting.  ?Genitourinary: Denied genitourinary symptoms including symptomatic vaginal discharge, pelvic relaxation issues, and urinary complaints.  ?Musculoskeletal: Denied musculoskeletal symptoms, stiffness, swelling, muscle weakness and myalgia.  ?Dermatologic: Denied dermatology symptoms, rash and scar.  ?Neurologic: Denied neurology symptoms, dizziness, headache, neck pain and syncope.  ?Psychiatric: Denied psychiatric symptoms, anxiety and depression.  ?Endocrine: Denied endocrine symptoms including hot flashes and night sweats.  ? ?Meds: ?  ?No current outpatient medications on file prior to visit.  ? ?No current facility-administered medications on file prior to visit.  ? ? ? ? ?Objective:  ?  ? ?Vitals:  ? 02/21/22 1521  ?BP: 102/69  ?Pulse: 90  ? ?Filed Weights  ? 02/21/22 1521  ?Weight: 196 lb 14.4 oz (89.3 kg)  ? ?  ?         Physical examination ?  Pelvic:   ?Vulva: Normal appearance.  No lesions.  ?Vagina: No lesions or abnormalities noted.  ?Support: Normal pelvic support.  ?Urethra No masses tenderness or scarring.  ?Meatus Normal size without lesions or prolapse.  ?Cervix: Normal appearance.  No lesions. IUD strings noted at cervical os.  ?Anus: Normal exam.  No lesions.  ?Perineum: Normal exam.  No lesions.  ?      Bimanual   ?Uterus: Normal size.  Non-tender.  Mobile.  AV.  ?Adnexae: No masses.  Non-tender to palpation.  ?Cul-de-sac: Negative for abnormality.  ? ? ?        ? ?Assessment:  ?  ?[id number] ?Patient Active Problem List  ? Diagnosis Date Noted  ? Trigeminal neuralgia of left side of face 05/30/2021  ? Gastroesophageal reflux disease 05/30/2021  ? Insomnia 02/14/2021  ? Cystocele, midline 02/15/2020  ?  Urinary, incontinence, stress female 01/19/2020  ? Obesity 08/31/2019  ? Transient neurological symptoms 08/31/2019  ? Chronic pain of left knee 08/31/2019  ? ?  ?1. Encounter for routine checking of intrauterine contraceptive device (IUD)   ? ?  ? ? ?Plan:  ?  ?       ? 1.  Expectant  management-expect menses to significantly decrease as the IUD works. ?Orders ?No orders of the defined types were placed in this encounter. ? ? No orders of the defined types were placed in this encounter. ?  ?  F/U ? No follow-ups on file. ?I spent 12 minutes involved in the care of this patient preparing to see the patient by obtaining and reviewing her medical history (including labs, imaging tests and prior procedures), documenting clinical information in the electronic health record (EHR), counseling and coordinating care plans, writing and sending prescriptions, ordering tests or procedures and in direct communicating with the patient and medical staff discussing pertinent items from her history and physical exam. ? ?Finis Bud, M.D. ?02/21/2022 ?3:52 PM ? ? ? ? ?

## 2022-02-21 NOTE — Progress Notes (Signed)
Patient presents today for 4 week IUD check. She states no pain or discomfort. She is currently on menstrual cycle. No questions or concerns at this time.  ?

## 2022-04-24 ENCOUNTER — Encounter: Payer: Medicaid Other | Admitting: Family Medicine

## 2022-05-01 ENCOUNTER — Encounter: Payer: Medicaid Other | Admitting: Physician Assistant

## 2022-05-24 NOTE — Progress Notes (Signed)
      Established patient visit  I,April Miller,acting as a scribe for [name], MD.,have documented all relevant documentation on the behalf of [name], MD,as directed by  [name], MD while in the presence of [name], MD.   Patient: Shelly Williams   DOB: June 24, 1981   41 y.o. Female  MRN: [id number] Visit Date: 05/25/2022  Today's healthcare provider: [name], MD   Chief Complaint  Patient presents with   Insomnia   Weight Gain   Subjective    HPI  Patient states she has been overeating for the past few months and been gaining a lot of weight. Patient states she can not control her eating. Patient also states she has been having cycles of insomnia since April. Was prescribed trazodone which was not helping. Patient states she averages around 4 hours of sleep a night and on some nights not even that much. Patient states she feels tired all the time.  She is followed by Gyn with history of fibroids on ultrasound in January, and had IUD placed in April to to long history of metamenorrhagia  Wt Readings from Last 5 Encounters:  05/25/22 203 lb (92.1 kg)  02/21/22 196 lb 14.4 oz (89.3 kg)  01/24/22 190 lb 3.2 oz (86.3 kg)  10/30/21 194 lb 8 oz (88.2 kg)  05/30/21 186 lb 11.2 oz (84.7 kg)    Medications: No outpatient medications prior to visit.   No facility-administered medications prior to visit.    Review of Systems     Objective    BP 106/68 (BP Location: Right Arm, Patient Position: Sitting, Cuff Size: Large)   Pulse 74   Resp 14   Ht '5\' 2"'$  (1.575 m)   Wt 203 lb (92.1 kg)   SpO2 99%   BMI 37.13 kg/m    Physical Exam   General: Appearance:    Obese female in no acute distress  Eyes:    PERRL, conjunctiva/corneas clear, EOM's intact       Lungs:     Clear to auscultation bilaterally, respirations unlabored  Heart:    Normal heart rate. Normal rhythm. No murmurs, rubs, or gallops.    MS:   All extremities are intact.    Neurologic:    Awake, alert, oriented x 3. No apparent focal neurological defect.         Assessment & Plan     1. Unexplained weight gain  - CBC - Comprehensive metabolic panel - T4, free - TSH  2. Insomnia, unspecified type     The entirety of the information documented in the History of Present Illness, Review of Systems and Physical Exam were personally obtained by me. Portions of this information were initially documented by the CMA and reviewed by me for thoroughness and accuracy.     [name], MD  [facility] [phone] (phone) [phone] (fax)  [place]

## 2022-05-25 ENCOUNTER — Encounter: Payer: Self-pay | Admitting: Family Medicine

## 2022-05-25 ENCOUNTER — Ambulatory Visit (INDEPENDENT_AMBULATORY_CARE_PROVIDER_SITE_OTHER): Payer: 59 | Admitting: Family Medicine

## 2022-05-25 VITALS — BP 106/68 | HR 74 | Resp 14 | Ht 62.0 in | Wt 203.0 lb

## 2022-05-25 DIAGNOSIS — R635 Abnormal weight gain: Secondary | ICD-10-CM

## 2022-05-25 DIAGNOSIS — G4700 Insomnia, unspecified: Secondary | ICD-10-CM

## 2022-07-23 ENCOUNTER — Encounter: Payer: Self-pay | Admitting: Family Medicine

## 2022-07-23 ENCOUNTER — Ambulatory Visit (INDEPENDENT_AMBULATORY_CARE_PROVIDER_SITE_OTHER): Payer: Medicaid Other | Admitting: Family Medicine

## 2022-07-23 VITALS — BP 117/79 | HR 79 | Temp 98.3°F | Resp 16 | Wt 207.0 lb

## 2022-07-23 DIAGNOSIS — G500 Trigeminal neuralgia: Secondary | ICD-10-CM

## 2022-07-23 DIAGNOSIS — Z23 Encounter for immunization: Secondary | ICD-10-CM

## 2022-07-23 DIAGNOSIS — E669 Obesity, unspecified: Secondary | ICD-10-CM | POA: Diagnosis not present

## 2022-07-23 DIAGNOSIS — Z6837 Body mass index (BMI) 37.0-37.9, adult: Secondary | ICD-10-CM

## 2022-07-23 DIAGNOSIS — F331 Major depressive disorder, recurrent, moderate: Secondary | ICD-10-CM

## 2022-07-23 MED ORDER — FLUOXETINE HCL 20 MG PO CAPS
20.0000 mg | ORAL_CAPSULE | Freq: Every day | ORAL | 3 refills | Status: DC
Start: 2022-07-23 — End: 2022-08-14

## 2022-07-23 NOTE — Assessment & Plan Note (Signed)
Recurrent Uncontrolled No SI/HI, but is having intrusive thoughts Did well on prozac in the past - will restart at 20 mg daily Consider increasing dose

## 2022-07-23 NOTE — Progress Notes (Signed)
I,Sulibeya S Dimas,acting as a Education administrator for Lavon Paganini, MD.,have documented all relevant documentation on the behalf of Lavon Paganini, MD,as directed by  Lavon Paganini, MD while in the presence of Lavon Paganini, MD.     Established patient visit   Patient: Shelly Williams   DOB: [birth year]   41 y.o. Female  MRN: [id number] Visit Date: 07/23/2022  Today's healthcare provider: Lavon Paganini, MD   Chief Complaint  Patient presents with   Insomnia   Subjective    Insomnia Primary symptoms: sleep disturbance, difficulty falling asleep.   The problem occurs intermittently. The problem is unchanged. The symptoms are aggravated by work stress, emotional upset and family issues. Past treatments include meditation. The treatment provided no relief. Typical bedtime:  10-11 P.M..  How long after going to bed to you fall asleep: 30-60 minutes.   PMH includes: family stress or anxiety, chronic pain.   patient reports she has been using Trazodone '50mg'$  as needed.    Follow up for left side facial pain  The patient was last seen for this 1 years ago. Changes made at last visit include no changes.  She reports excellent compliance with treatment. She feels that condition is Unchanged. She is not having side effects.  Patient requesting referral to different neurologist for send opinion.  ----------------------------------------------------------------------------------------- Feels like she can eat all day. Related to depressed mood. Feels hungry even when laying in bed.  Ongoing for several months.  Having a lot of stress at home.  Feeling like a failure. There are days when she doesn't want ot get out of bed. Low energy. Intrusive thoughts, no SI/HI.  Medications: No outpatient medications prior to visit.   No facility-administered medications prior to visit.    Review of Systems  Constitutional:  Positive for fatigue. Negative for appetite change.  Respiratory:   Negative for shortness of breath.   Cardiovascular:  Negative for chest pain, palpitations and leg swelling.  Gastrointestinal:  Negative for abdominal pain, diarrhea, nausea and vomiting.  Psychiatric/Behavioral:  Positive for sleep disturbance. The patient has insomnia.        Objective    BP 117/79 (BP Location: Left Arm, Patient Position: Sitting, Cuff Size: Large)   Pulse 79   Temp 98.3 F (36.8 C) (Oral)   Resp 16   Wt 207 lb (93.9 kg)   LMP 07/10/2022 (Exact Date)   BMI 37.86 kg/m    Physical Exam Vitals reviewed.  Constitutional:      General: She is not in acute distress.    Appearance: Normal appearance. She is well-developed. She is not diaphoretic.  HENT:     Head: Normocephalic and atraumatic.  Eyes:     General: No scleral icterus.    Conjunctiva/sclera: Conjunctivae normal.  Neck:     Thyroid: No thyromegaly.  Cardiovascular:     Rate and Rhythm: Normal rate and regular rhythm.     Pulses: Normal pulses.     Heart sounds: Normal heart sounds. No murmur heard. Pulmonary:     Effort: Pulmonary effort is normal. No respiratory distress.     Breath sounds: Normal breath sounds. No wheezing, rhonchi or rales.  Musculoskeletal:     Cervical back: Neck supple.     Right lower leg: No edema.     Left lower leg: No edema.  Lymphadenopathy:     Cervical: No cervical adenopathy.  Skin:    General: Skin is warm and dry.     Findings: No rash.  Neurological:  Mental Status: She is alert and oriented to person, place, and time. Mental status is at baseline.  Psychiatric:        Attention and Perception: Attention normal.        Mood and Affect: Mood is depressed. Affect is tearful.        Speech: Speech normal.       No results found for any visits on 07/23/22.  Assessment & Plan     Problem List Items Addressed This Visit       Nervous and Auditory   Trigeminal neuralgia of left side of face    Chronic and uncontrolled Ailed gabapentin due to  intolerance due to drowsiness F/b neuro - wants 2nd opinion - referral placed today No longer on carbamasepine or topamax Consider lyrica vs amitriptyline      Relevant Medications   FLUoxetine (PROZAC) 20 MG capsule   Other Relevant Orders   B12   Ambulatory referral to Neurology     Other   Obesity    Discussed importance of healthy weight management Discussed diet and exercise Will work on depression management to help with appetite Consider wellbutrin in the future      Relevant Orders   CBC   TSH   VITAMIN D 25 Hydroxy (Vit-D Deficiency, Fractures)   B12   Comprehensive metabolic panel   MDD (major depressive disorder) - Primary    Recurrent Uncontrolled No SI/HI, but is having intrusive thoughts Did well on prozac in the past - will restart at 20 mg daily Consider increasing dose      Relevant Medications   FLUoxetine (PROZAC) 20 MG capsule   Other Relevant Orders   CBC   TSH   VITAMIN D 25 Hydroxy (Vit-D Deficiency, Fractures)   B12   Comprehensive metabolic panel     Return in about 2 months (around 09/22/2022) for MDD/GAD f/u, virtual ok.      I, Lavon Paganini, MD, have reviewed all documentation for this visit. The documentation on 07/23/22 for the exam, diagnosis, procedures, and orders are all accurate and complete.   Jessi Jessop, Dionne Bucy, MD, MPH [facility]

## 2022-07-23 NOTE — Assessment & Plan Note (Signed)
Chronic and uncontrolled Ailed gabapentin due to intolerance due to drowsiness F/b neuro - wants 2nd opinion - referral placed today No longer on carbamasepine or topamax Consider lyrica vs amitriptyline

## 2022-07-23 NOTE — Assessment & Plan Note (Signed)
Discussed importance of healthy weight management Discussed diet and exercise Will work on depression management to help with appetite Consider wellbutrin in the future

## 2022-07-24 LAB — COMPREHENSIVE METABOLIC PANEL
ALT: 21 IU/L (ref 0–32)
AST: 20 IU/L (ref 0–40)
Albumin/Globulin Ratio: 1.8 (ref 1.2–2.2)
Albumin: 4.2 g/dL (ref 3.9–4.9)
Alkaline Phosphatase: 101 IU/L (ref 44–121)
BUN/Creatinine Ratio: 13 (ref 9–23)
BUN: 8 mg/dL (ref 6–24)
Bilirubin Total: 0.6 mg/dL (ref 0.0–1.2)
CO2: 22 mmol/L (ref 20–29)
Calcium: 8.9 mg/dL (ref 8.7–10.2)
Chloride: 106 mmol/L (ref 96–106)
Creatinine, Ser: 0.6 mg/dL (ref 0.57–1.00)
Globulin, Total: 2.3 g/dL (ref 1.5–4.5)
Glucose: 94 mg/dL (ref 70–99)
Potassium: 4 mmol/L (ref 3.5–5.2)
Sodium: 141 mmol/L (ref 134–144)
Total Protein: 6.5 g/dL (ref 6.0–8.5)
eGFR: 116 mL/min/{1.73_m2} (ref >59)

## 2022-07-24 LAB — TSH: TSH: 2.19 u[IU]/mL (ref 0.450–4.500)

## 2022-07-24 LAB — CBC
Hematocrit: 39.8 % (ref 34.0–46.6)
Hemoglobin: 12.9 g/dL (ref 11.1–15.9)
MCH: 27.6 pg (ref 26.6–33.0)
MCHC: 32.4 g/dL (ref 31.5–35.7)
MCV: 85 fL (ref 79–97)
Platelets: 167 10*3/uL (ref 150–450)
RBC: 4.67 x10E6/uL (ref 3.77–5.28)
RDW: 13.6 % (ref 11.7–15.4)
WBC: 8.5 10*3/uL (ref 3.4–10.8)

## 2022-07-24 LAB — VITAMIN D 25 HYDROXY (VIT D DEFICIENCY, FRACTURES): Vit D, 25-Hydroxy: 26.9 ng/mL — ABNORMAL LOW (ref 30.0–100.0)

## 2022-07-24 LAB — VITAMIN B12: Vitamin B-12: 450 pg/mL (ref 232–1245)

## 2022-08-14 ENCOUNTER — Other Ambulatory Visit: Payer: Self-pay | Admitting: Family Medicine

## 2022-08-22 ENCOUNTER — Encounter: Payer: Self-pay | Admitting: Neurology

## 2022-08-22 ENCOUNTER — Ambulatory Visit: Payer: Medicaid Other | Admitting: Neurology

## 2022-08-22 VITALS — BP 103/66 | HR 66 | Ht 62.0 in | Wt 202.6 lb

## 2022-08-22 DIAGNOSIS — G500 Trigeminal neuralgia: Secondary | ICD-10-CM

## 2022-08-22 NOTE — Patient Instructions (Addendum)
Continue with Dr. Melrose Nakayama    Trigeminal Neuralgia  Trigeminal neuralgia is a nerve disorder that causes severe pain on one side of the face. The pain may last from a few seconds to several minutes, but it can happen hundreds of times a day. The pain is usually only on one side of the face. Symptoms may occur for days, weeks, or months and then go away for months or years. The pain may return and be worse than before. What are the causes? This condition may be caused by: Damage or pressure to a nerve in the head that is called the trigeminal nerve. An attack can be triggered by: Talking or chewing. Putting on makeup. Washing, shaving, or touching your face. Brushing your teeth. Blasts of hot or cold air. Primary demyelinating disorders, such as multiple sclerosis. Tumors. What increases the risk? You are more likely to develop this condition if: You are 36-76 years old. You are female. What are the signs or symptoms? The main symptom of this condition is severe pain in the jaw, lips, eyes, nose, scalp, forehead, and face. How is this diagnosed? This condition is diagnosed with a physical exam. A CT scan or an MRI may be done to rule out other conditions that can cause facial pain. How is this treated? This condition may be treated with: Measures to avoid the things that trigger your symptoms. Prescription medicines such as anticonvulsants. Procedures such as ablation, thermal, or radiation therapy. Cognitive or behavioral therapy. Complementary therapies such as: Gentle, regular exercise or yoga. Meditation. Aromatherapy. Acupuncture. Surgery. This may be done in severe cases if other medical treatment does not provide relief. It may take up to one month for treatment to start relieving the pain. Follow these instructions at home: Managing pain  Learn as much as you can about how to manage your pain. Ask your health care provider if a pain specialist would be  helpful. Consider talking with a mental health care provider about how to cope with the pain. Consider joining a pain support group. General instructions Take over-the-counter and prescription medicines only as told by your health care provider. Avoid the things that trigger your symptoms. It may help to: Chew on the unaffected side of your mouth. Avoid touching your face. Avoid blasts of hot or cold air. Keep all follow-up visits. Where to find more information Facial Pain Association: facepain.org Contact a health care provider if: Your medicine is not helping your symptoms. You have side effects from the medicine used for treatment. You develop new, unexplained symptoms, such as: Double vision. Facial weakness or numbness. Changes in hearing or balance. You feel depressed. Get help right away if: Your pain is severe and is not getting better. You develop suicidal thoughts. If you ever feel like you may hurt yourself or others, or have thoughts about taking your own life, get help right away. Go to your nearest emergency department or: Call your local emergency services (911 in the [place] Call a suicide crisis helpline, such as the [place] at 305-285-5640 or 988 in the [place]. This is open 24 hours a day in the [place] Text the Crisis Text Line at (442)772-5538 (in the [place].). Summary Trigeminal neuralgia is a nerve disorder that causes severe pain on one side of the face. The pain may last from a few seconds to several minutes. This condition is caused by damage or pressure to a nerve in the head that is called the trigeminal nerve. Treatment may include  avoiding the things that trigger your symptoms, taking medicines, or having procedures or surgery. It may take up to one month for treatment to start relieving the pain. Keep all follow-up visits. This information is not intended to replace advice given to you by your health care provider. Make sure you discuss any  questions you have with your health care provider. Document Revised: 04/20/2021 Document Reviewed: 03/20/2021 Elsevier Patient Education  [place].

## 2022-08-22 NOTE — Progress Notes (Signed)
[facility]    Provider:  Dr Jaynee Eagles Requesting Provider: Virginia Crews, MD Primary Care Provider:  Virginia Crews, MD  CC:  trigeminal neuralgia  HPI:  Shelly Williams is a 41 y.o. female here as requested by Virginia Crews, MD for left facial pain. Per Dr. Brita Romp she is doing well, stable, tolerating treatment, but still wanted a second opinion. Per neuro  notes I reviewed, she sees Dr. Melrose Nakayama, she saw dentist, takes tramadol 76m twice a day only as needed (last taken 2 months) previously tried gabapentin, sharp shooting pain, also tried carbamazepine.  The pain is sharp, feels it in the lower jaw, more when stressed, shooting quality in V3, doesn't happen with chewing but have some numbness on the left side, she does not have the pain often. She takes tylenol and when it is very bad she takes tramadol, last time it was bad was 2 months ago. She had an MRI of the brain and followed with dentistry. Usually mild but when moderate take tramadol. Episodes are Brief. Ongoing a year. Happens a few times a month not often. No other focal neurologic deficits, associated symptoms, inciting events or modifiable factors.   Reviewed notes, labs and imaging from outside physicians, which showed:  MRI brain 01/25/2021: CLINICAL DATA:  Left-sided facial pain and jaw pain, difficulty chewing   EXAM: MRI HEAD WITHOUT AND WITH CONTRAST   TECHNIQUE: Multiplanar, multiecho pulse sequences of the brain and surrounding structures were obtained without and with intravenous contrast.   CONTRAST:  884mGADAVIST GADOBUTROL 1 MMOL/ML IV SOLN   COMPARISON:  None.   FINDINGS: Brain: There is no acute infarction or intracranial hemorrhage. There is no intracranial mass, mass effect, or edema. There is no hydrocephalus or extra-axial fluid collection. Ventricles and sulci are normal in size and configuration, noting incidental cavum septum pellucidum et vergae. Minimal  punctate foci of T2 hyperintensity in the bifrontal subcortical white matter likely reflects nonspecific gliosis/demyelination of doubtful clinical significance. No abnormal enhancement.   Vascular: Major vessel flow voids at the skull base are preserved.   Skull and upper cervical spine: Normal marrow signal is praeserved.   Sinuses/Orbits: Mild mucosal thickening at the alveolar recess of the left maxillary sinus is likely reactive to below. Orbits are unremarkable.   Other: There is enhancement along the posterior left maxillary alveolus with suspected dehiscence buccal cortex. There is also some asymmetric enhancement of the adjacent soft tissue (series 18, image 13). Sella is partially empty. Mastoid air cells are clear.   IMPRESSION: No significant intracranial abnormality.   Suspect left posterior maxillary molar periodontal disease with dehiscence of cortex and adjacent soft tissue inflammation. Dental examination recommended. Maxillofacial CT could be considered.   These results will be called to the ordering clinician or representative by the Radiologist Assistant, and communication documented in the PACS or [employer]    Electronically Signed   By: PrMacy Mis.D.   On: 01/25/2021 12:50  Recent Results (from the past 2160 hour(s))  CBC     Status: None   Collection Time: 07/23/22  1:08 PM  Result Value Ref Range   WBC 8.5 3.4 - 10.8 x10E3/uL   RBC 4.67 3.77 - 5.28 x10E6/uL   Hemoglobin 12.9 11.1 - 15.9 g/dL   Hematocrit 39.8 34.0 - 46.6 %   MCV 85 79 - 97 fL   MCH 27.6 26.6 - 33.0 pg   MCHC 32.4 31.5 - 35.7 g/dL   RDW 13.6  11.7 - 15.4 %   Platelets 167 150 - 450 x10E3/uL  TSH     Status: None   Collection Time: 07/23/22  1:08 PM  Result Value Ref Range   TSH 2.190 0.450 - 4.500 uIU/mL  VITAMIN D 25 Hydroxy (Vit-D Deficiency, Fractures)     Status: Abnormal   Collection Time: 07/23/22  1:08 PM  Result Value Ref Range   Vit D, 25-Hydroxy 26.9  (L) 30.0 - 100.0 ng/mL    Comment: Vitamin D deficiency has been defined by the [place] practice guideline as a level of serum 25-OH vitamin D less than 20 ng/mL (1,2). The Endocrine Society went on to further define vitamin D insufficiency as a level between 21 and 29 ng/mL (2). 1. IOM (Institute of Medicine). 2010. Dietary reference    intakes for calcium and D. [place]: The    [employer]. 2. Holick MF, Binkley Cornell, Bischoff-Ferrari HA, et al.    Evaluation, treatment, and prevention of vitamin D    deficiency: an Endocrine Society clinical practice    guideline. JCEM. 2011 Jul; 96(7):1911-30.   B12     Status: None   Collection Time: 07/23/22  1:08 PM  Result Value Ref Range   Vitamin B-12 450 232 - 1,245 pg/mL  Comprehensive metabolic panel     Status: None   Collection Time: 07/23/22  1:08 PM  Result Value Ref Range   Glucose 94 70 - 99 mg/dL   BUN 8 6 - 24 mg/dL   Creatinine, Ser 0.60 0.57 - 1.00 mg/dL   eGFR 116 >59 mL/min/1.73   BUN/Creatinine Ratio 13 9 - 23   Sodium 141 134 - 144 mmol/L   Potassium 4.0 3.5 - 5.2 mmol/L   Chloride 106 96 - 106 mmol/L   CO2 22 20 - 29 mmol/L   Calcium 8.9 8.7 - 10.2 mg/dL   Total Protein 6.5 6.0 - 8.5 g/dL   Albumin 4.2 3.9 - 4.9 g/dL   Globulin, Total 2.3 1.5 - 4.5 g/dL   Albumin/Globulin Ratio 1.8 1.2 - 2.2   Bilirubin Total 0.6 0.0 - 1.2 mg/dL   Alkaline Phosphatase 101 44 - 121 IU/L   AST 20 0 - 40 IU/L   ALT 21 0 - 32 IU/L     Review of Systems: Patient complains of symptoms per HPI as well as the following symptoms left TGN. Pertinent negatives and positives per HPI. All others negative.   Social History   Socioeconomic History   Marital status: Married    Spouse name: Not on file   Number of children: 4   Years of education: Not on file   Highest education level: Not on file  Occupational History   Not on file  Tobacco Use   Smoking status: Never    Smokeless tobacco: Never  Vaping Use   Vaping Use: Never used  Substance and Sexual Activity   Alcohol use: No   Drug use: No   Sexual activity: Yes    Partners: Male    Birth control/protection: Surgical  Other Topics Concern   Not on file  Social History Narrative   Caffiene; rare, no soda   Working Pensions consultant   Social Determinants of Radio broadcast assistant Strain: Not on file  Food Insecurity: Not on file  Transportation Needs: Not on file  Physical Activity: Not on file  Stress: Not on file  Social Connections: Not on file  Intimate Partner  Violence: Not on file    Family History  Problem Relation Age of Onset   Lung cancer Maternal Aunt        lung   Cancer Other        lung - maternal great aunt   Breast cancer Maternal Aunt        breast   Healthy Mother    Healthy Father    Healthy Sister    Healthy Brother    Healthy Daughter    Healthy Son    Healthy Daughter    Healthy Daughter    Colon cancer Neg Hx    Ovarian cancer Neg Hx     Past Medical History:  Diagnosis Date   Abdominal pain 2014   Depression    Gallstones 2014   History of kidney stones     Patient Active Problem List   Diagnosis Date Noted   Trigeminal neuralgia of left side of face 05/30/2021   Gastroesophageal reflux disease 05/30/2021   Insomnia 02/14/2021   Cystocele, midline 02/15/2020   Urinary, incontinence, stress female 01/19/2020   Obesity 08/31/2019   MDD (major depressive disorder) 08/31/2019   Transient neurological symptoms 08/31/2019   Chronic pain of left knee 08/31/2019    Past Surgical History:  Procedure Laterality Date   BLADDER SUSPENSION N/A 06/28/2020   Procedure: PUBOVAGINAL SLING  PROCEDURE - TVT EXACT;  Surgeon: Gae Dry, MD;  Location: [facility];  Service: Gynecology;  Laterality: N/A;   CHOLECYSTECTOMY     CYSTOCELE REPAIR N/A 06/28/2020   Procedure: ANTERIOR REPAIR (CYSTOCELE);  Surgeon: Gae Dry, MD;  Location: [facility];   Service: Gynecology;  Laterality: N/A;   CYSTOSCOPY  06/28/2020   Procedure: CYSTOSCOPY;  Surgeon: Gae Dry, MD;  Location: [facility];  Service: Gynecology;;   TUBAL LIGATION      Current Outpatient Medications  Medication Sig Dispense Refill   FLUoxetine (PROZAC) 20 MG capsule TAKE 1 CAPSULE BY MOUTH EVERY DAY 90 capsule 0   No current facility-administered medications for this visit.    Allergies as of 08/22/2022   (No Known Allergies)    Vitals: BP 103/66   Pulse 66   Ht _0  (1.575 m)   Wt 202 lb 9.6 oz (91.9 kg)   LMP 07/10/2022 (Exact Date)   BMI 37.06 kg/m  Last Weight:  Wt Readings from Last 1 Encounters:  08/22/22 202 lb 9.6 oz (91.9 kg)   Last Height:   Ht Readings from Last 1 Encounters:  08/22/22 _1  (1.575 m)     Physical exam: Exam: Gen: NAD, conversant, well nourised, obese, well groomed                     CV: RRR, no MRG. No Carotid Bruits. No peripheral edema, warm, nontender Eyes: Conjunctivae clear without exudates or hemorrhage  Neuro: Detailed Neurologic Exam  Speech:    Speech is normal; fluent and spontaneous with normal comprehension.  Cognition:    The patient is oriented to person, place, and time;     recent and remote memory intact;     language fluent;     normal attention, concentration,     fund of knowledge Cranial Nerves:    The pupils are equal, round, and reactive to light. The fundi are normal. Visual fields are full to finger confrontation. Extraocular movements are intact. Trigeminal sensation is intact and the muscles of mastication are normal. The face is symmetric. The palate elevates  in the midline. Hearing intact. Voice is normal. Shoulder shrug is normal. The tongue has normal motion without fasciculations.   Coordination:    Normal   Gait:     normal.   Motor Observation:    No asymmetry, no atrophy, and no involuntary movements noted. Tone:    Normal muscle tone.    Posture:    Posture is  normal. normal erect    Strength:    Strength is V/V in the upper and lower limbs.      Sensation: intact to LT     Reflex Exam:  DTR's:    Deep tendon reflexes in the upper and lower extremities are symmetrical bilaterally.   Toes:    The toes are downgoing bilaterally.   Clonus:    Clonus is absent.    Assessment/Plan:  patient with left-sided trigeminal neuralgia, mild, occassional, every few months uses tramadol. Sees Dr. Melrose Nakayama, In the futrue if worsening can have mri trigeminal protocol but at this time so mild and treated I suggest she stay with Dr. Melrose Nakayama, he is well aware of how to treat TGN.     Cc: Virginia Crews, MD,  Brita Romp Dionne Bucy, MD  Sarina Ill, MD  [facility] [address] [place], [place] [address]  Phone [phone] Fax [phone]

## 2022-09-24 NOTE — Progress Notes (Unsigned)
      Established patient visit   Patient: Shelly Williams   DOB: 22-Jun-1981   41 y.o. Female  MRN: [id number] Visit Date: 09/25/2022  Today's healthcare provider: Lavon Paganini, MD   No chief complaint on file.  Subjective    HPI  Depression, Follow-up  She  was last seen for this 2 months ago. Changes made at last visit include restart at 20 mg daily. Consider increasing dose.   She reports {excellent/good/fair/poor:19665} compliance with treatment. She {is/is not:21021397} having side effects. ***     07/23/2022   10:54 AM 05/25/2022    2:07 PM 10/30/2021   10:47 AM  Depression screen PHQ 2/9  Decreased Interest 2 2 0  Down, Depressed, Hopeless 1 1 0  PHQ - 2 Score 3 3 0  Altered sleeping '1 1 1  '$ Tired, decreased energy '2 2 2  '$ Change in appetite '2 1 2  '$ Feeling bad or failure about yourself  1 1 0  Trouble concentrating 1 2 0  Moving slowly or fidgety/restless 1 0 0  Suicidal thoughts 1 0 0  PHQ-9 Score '12 10 5  '$ Difficult doing work/chores Somewhat difficult  Not difficult at all    -----------------------------------------------------------------------------------------    Medications: Outpatient Medications Prior to Visit  Medication Sig   FLUoxetine (PROZAC) 20 MG capsule TAKE 1 CAPSULE BY MOUTH EVERY DAY   No facility-administered medications prior to visit.    Review of Systems  {Labs  Heme  Chem  Endocrine  Serology  Results Review (optional):23779}   Objective    There were no vitals taken for this visit. {Show previous vital signs (optional):23777}  Physical Exam  ***  No results found for any visits on 09/25/22.  Assessment & Plan     ***  No follow-ups on file.      {provider attestation***:1}   Lavon Paganini, MD  [facility] [phone] (phone) [phone] (fax)  [place]

## 2022-09-25 ENCOUNTER — Ambulatory Visit (INDEPENDENT_AMBULATORY_CARE_PROVIDER_SITE_OTHER): Payer: Medicaid Other | Admitting: Family Medicine

## 2022-09-25 ENCOUNTER — Encounter: Payer: Self-pay | Admitting: Family Medicine

## 2022-09-25 VITALS — BP 97/61 | HR 62 | Temp 98.7°F | Resp 16 | Ht 62.0 in | Wt 204.4 lb

## 2022-09-25 DIAGNOSIS — G43009 Migraine without aura, not intractable, without status migrainosus: Secondary | ICD-10-CM | POA: Diagnosis not present

## 2022-09-25 DIAGNOSIS — F331 Major depressive disorder, recurrent, moderate: Secondary | ICD-10-CM | POA: Diagnosis not present

## 2022-09-25 DIAGNOSIS — E669 Obesity, unspecified: Secondary | ICD-10-CM

## 2022-09-25 DIAGNOSIS — Z6837 Body mass index (BMI) 37.0-37.9, adult: Secondary | ICD-10-CM

## 2022-09-25 DIAGNOSIS — G4440 Drug-induced headache, not elsewhere classified, not intractable: Secondary | ICD-10-CM | POA: Diagnosis not present

## 2022-09-25 MED ORDER — FLUOXETINE HCL 20 MG PO CAPS: 20.0000 mg | ORAL_CAPSULE | Freq: Every day | ORAL | 1 refills | Status: DC

## 2022-09-25 MED ORDER — SUMATRIPTAN SUCCINATE 50 MG PO TABS: 50.0000 mg | ORAL_TABLET | ORAL | 5 refills | Status: DC | PRN

## 2022-09-25 NOTE — Assessment & Plan Note (Signed)
See plan above.

## 2022-09-25 NOTE — Assessment & Plan Note (Signed)
Discussed importance of healthy weight management Discussed diet and exercise  Trial of intermittent fasting

## 2022-09-25 NOTE — Assessment & Plan Note (Signed)
Well controlled Continue prozac at current dose 

## 2022-09-25 NOTE — Assessment & Plan Note (Signed)
Ongoing intermittent problem Will try sumatriptan Getting rebound headaches from tylenol and had this previously from excedrin - would avoid taking regularly

## 2022-10-17 ENCOUNTER — Ambulatory Visit
Admission: RE | Admit: 2022-10-17 | Discharge: 2022-10-17 | Disposition: A | Discharge status: Home / Self Care | Payer: Medicaid Other | Source: Ambulatory Visit | Attending: Nurse Practitioner | Admitting: Nurse Practitioner

## 2022-10-17 VITALS — BP 95/67 | HR 76 | Temp 98.3°F | Resp 16

## 2022-10-17 DIAGNOSIS — A084 Viral intestinal infection, unspecified: Secondary | ICD-10-CM

## 2022-10-17 LAB — POCT URINALYSIS DIP (MANUAL ENTRY)
Bilirubin, UA: NEGATIVE
Blood, UA: NEGATIVE
Glucose, UA: NEGATIVE mg/dL
Leukocytes, UA: NEGATIVE
Nitrite, UA: NEGATIVE
Protein Ur, POC: NEGATIVE mg/dL
Spec Grav, UA: 1.025 (ref 1.010–1.025)
Urobilinogen, UA: 0.2 E.U./dL
pH, UA: 5.5 (ref 5.0–8.0)

## 2022-10-17 LAB — POCT URINE PREGNANCY: Preg Test, Ur: NEGATIVE

## 2022-10-17 MED ORDER — ONDANSETRON 4 MG PO TBDP: 4.0000 mg | ORAL_TABLET | Freq: Three times a day (TID) | ORAL | 0 refills | Status: DC | PRN

## 2022-10-17 NOTE — ED Triage Notes (Signed)
Pt reports low abdominal pain and diarrhea x 3 days. Pt has not taken any meds for complaints. States she can no seep at night due to pain. Pain is worse when ay on the side.

## 2022-10-17 NOTE — Discharge Instructions (Signed)
I suspect your symptoms are related to a viral stomach bug.  Make sure you are drinking plenty of liquids.  You can take the Zofran under your tongue every 8 hours as needed for nausea or vomiting.  You can try a warm compress or Tylenol as needed for pain in your belly.  Hopefully, your symptoms will last only a couple of more days at the most and then you will start to feel better.  Make sure you are drinking plenty of fluids, avoid anything heavy for the next couple of days.  If you feel like eating, you can try soup, broth, or easy to digest foods like pudding or applesauce.

## 2022-10-17 NOTE — ED Provider Notes (Signed)
[facility]    CSN: [id number] Arrival date & time: 10/17/22  1151      History   Chief Complaint Chief Complaint  Patient presents with   Diarrhea    I've been having diarrhea for the last 3days including nausea and terrible abdominal pain throughout the days and nights. - Entered by patient   Abdominal Pain    HPI Shelly Williams is a 42 y.o. female.   Patient presents today for approximately 36-hour history of abdominal cramping, diarrhea, nausea without vomiting, decreased appetite.  She endorses at least 7 episodes of diarrhea this morning between 4 and 7 AM.  Describes as pure water.  It is nonbloody.  Also feels nauseous, however has not vomited.  No known fevers, bodyaches or chills.  Reports she has tolerated sips of liquids today without vomiting but is scared to drink too much.  Reports a stomach bug has been going through her house, but usually everybody is feeling better after 1 day.  Denies recent antibiotic use or recent foreign travel.  No recent ingestion of suspicious substances such as unclean water or fast food.  Has not taken anything for symptoms so far.    Past Medical History:  Diagnosis Date   Abdominal pain 2014   Depression    Gallstones 2014   History of kidney stones     Patient Active Problem List   Diagnosis Date Noted   Migraine without aura and without status migrainosus, not intractable 09/25/2022   Rebound headache 09/25/2022   Trigeminal neuralgia of left side of face 05/30/2021   Gastroesophageal reflux disease 05/30/2021   Insomnia 02/14/2021   Cystocele, midline 02/15/2020   Urinary, incontinence, stress female 01/19/2020   Obesity 08/31/2019   MDD (major depressive disorder) 08/31/2019   Transient neurological symptoms 08/31/2019   Chronic pain of left knee 08/31/2019    Past Surgical History:  Procedure Laterality Date   BLADDER SUSPENSION N/A 06/28/2020   Procedure: PUBOVAGINAL SLING  PROCEDURE - TVT EXACT;   Surgeon: Gae Dry, MD;  Location: [facility];  Service: Gynecology;  Laterality: N/A;   CHOLECYSTECTOMY     CYSTOCELE REPAIR N/A 06/28/2020   Procedure: ANTERIOR REPAIR (CYSTOCELE);  Surgeon: Gae Dry, MD;  Location: [facility];  Service: Gynecology;  Laterality: N/A;   CYSTOSCOPY  06/28/2020   Procedure: CYSTOSCOPY;  Surgeon: Gae Dry, MD;  Location: [facility];  Service: Gynecology;;   TUBAL LIGATION      OB History     Gravida  6   Para  4   Term  4   Preterm      AB  2   Living  4      SAB  2   IAB      Ectopic      Multiple      Live Births  4        Obstetric Comments  1st Menstrual Cycle: 15 1st Pregnancy: 20          Home Medications    Prior to Admission medications   Medication Sig Start Date End Date Taking? Authorizing Provider  ondansetron (ZOFRAN-ODT) 4 MG disintegrating tablet Take 1 tablet (4 mg total) by mouth every 8 (eight) hours as needed for nausea or vomiting. 10/17/22  Yes Eulogio Bear, NP  FLUoxetine (PROZAC) 20 MG capsule Take 1 capsule (20 mg total) by mouth daily. 09/25/22   Virginia Crews, MD  SUMAtriptan (IMITREX) 50 MG tablet Take 1  tablet (50 mg total) by mouth every 2 (two) hours as needed for migraine. May repeat in 2 hours if headache persists or recurs. 09/25/22   Bacigalupo, Dionne Bucy, MD    Family History Family History  Problem Relation Age of Onset   Lung cancer Maternal Aunt        lung   Cancer Other        lung - maternal great aunt   Breast cancer Maternal Aunt        breast   Healthy Mother    Healthy Father    Healthy Sister    Healthy Brother    Healthy Daughter    Healthy Son    Healthy Daughter    Healthy Daughter    Colon cancer Neg Hx    Ovarian cancer Neg Hx     Social History Social History   Tobacco Use   Smoking status: Never   Smokeless tobacco: Never  Vaping Use   Vaping Use: Never used  Substance Use Topics   Alcohol use: No   Drug use: No      Allergies   Patient has no known allergies.   Review of Systems Review of Systems Per HPI  Physical Exam Triage Vital Signs ED Triage Vitals  Enc Vitals Group     BP 10/17/22 1338 95/67     Pulse Rate 10/17/22 1338 76     Resp 10/17/22 1338 16     Temp 10/17/22 1338 98.3 F (36.8 C)     Temp Source 10/17/22 1338 Oral     SpO2 10/17/22 1338 98 %     Weight --      Height --      Head Circumference --      Peak Flow --      Pain Score 10/17/22 1337 10     Pain Loc --      Pain Edu? --      Excl. in [place]? --    No data found.  Updated Vital Signs BP 95/67 (BP Location: Right Arm)   Pulse 76   Temp 98.3 F (36.8 C) (Oral)   Resp 16   LMP  (Within Months) Comment: 1 month  SpO2 98%   Visual Acuity Right Eye Distance:   Left Eye Distance:   Bilateral Distance:    Right Eye Near:   Left Eye Near:    Bilateral Near:     Physical Exam Vitals and nursing note reviewed.  Constitutional:      General: She is not in acute distress.    Appearance: Normal appearance. She is not toxic-appearing.  HENT:     Head: Normocephalic and atraumatic.     Mouth/Throat:     Mouth: Mucous membranes are moist.     Pharynx: Oropharynx is clear.  Eyes:     General: No scleral icterus.    Extraocular Movements: Extraocular movements intact.  Cardiovascular:     Rate and Rhythm: Normal rate and regular rhythm.  Pulmonary:     Effort: Pulmonary effort is normal. No respiratory distress.     Breath sounds: Normal breath sounds. No wheezing, rhonchi or rales.  Abdominal:     General: Abdomen is flat. Bowel sounds are normal. There is no distension.     Palpations: Abdomen is soft.     Tenderness: There is no abdominal tenderness. There is no right CVA tenderness, left CVA tenderness or guarding. Negative signs include Murphy's sign and McBurney's sign.  Musculoskeletal:  Cervical back: Normal range of motion.  Lymphadenopathy:     Cervical: No cervical adenopathy.   Skin:    General: Skin is warm and dry.     Capillary Refill: Capillary refill takes less than 2 seconds.     Coloration: Skin is not jaundiced or pale.     Findings: No erythema.  Neurological:     Mental Status: She is alert and oriented to person, place, and time.  Psychiatric:        Behavior: Behavior is cooperative.      UC Treatments / Results  Labs (all labs ordered are listed, but only abnormal results are displayed) Labs Reviewed  POCT URINALYSIS DIP (MANUAL ENTRY) - Abnormal; Notable for the following components:      Result Value   Ketones, POC UA moderate (40) (*)    All other components within normal limits  POCT URINE PREGNANCY    EKG   Radiology No results found.  Procedures Procedures (including critical care time)  Medications Ordered in UC Medications - No data to display  Initial Impression / Assessment and Plan / UC Course  I have reviewed the triage vital signs and the nursing notes.  Pertinent labs & imaging results that were available during my care of the patient were reviewed by me and considered in my medical decision making (see chart for details).   Patient is well-appearing, normotensive, afebrile, not tachycardic, not tachypneic, oxygenating well on room air.  Blood pressure is slightly soft today in triage, she reports this is her baseline.  Viral gastroenteritis Suspect viral gastroenteritis Noted flags in history or on exam Examination today is reassuring Start Zofran ODT 4 mg every 8 hours as needed for nausea/vomiting Discouraged use of Pepto-Bismol or Imodium to prevent toxic megacolon Other supportive care discussed ER and return precautions discussed with patient  The patient was given the opportunity to ask questions.  All questions answered to their satisfaction.  The patient is in agreement to this plan.    Final Clinical Impressions(s) / UC Diagnoses   Final diagnoses:  Viral gastroenteritis     Discharge  Instructions      I suspect your symptoms are related to a viral stomach bug.  Make sure you are drinking plenty of liquids.  You can take the Zofran under your tongue every 8 hours as needed for nausea or vomiting.  You can try a warm compress or Tylenol as needed for pain in your belly.  Hopefully, your symptoms will last only a couple of more days at the most and then you will start to feel better.  Make sure you are drinking plenty of fluids, avoid anything heavy for the next couple of days.  If you feel like eating, you can try soup, broth, or easy to digest foods like pudding or applesauce.    ED Prescriptions     Medication Sig Dispense Auth. Provider   ondansetron (ZOFRAN-ODT) 4 MG disintegrating tablet Take 1 tablet (4 mg total) by mouth every 8 (eight) hours as needed for nausea or vomiting. 20 tablet Eulogio Bear, NP      PDMP not reviewed this encounter.   Eulogio Bear, NP 10/17/22 1435

## 2023-01-30 ENCOUNTER — Ambulatory Visit
Admission: RE | Admit: 2023-01-30 | Discharge: 2023-01-30 | Disposition: A | Discharge status: Home / Self Care | Payer: Medicaid Other | Source: Ambulatory Visit | Attending: Family Medicine | Admitting: Family Medicine

## 2023-01-30 VITALS — BP 127/74 | HR 73 | Temp 97.7°F | Resp 20

## 2023-01-30 DIAGNOSIS — R300 Dysuria: Secondary | ICD-10-CM

## 2023-01-30 DIAGNOSIS — N3090 Cystitis, unspecified without hematuria: Secondary | ICD-10-CM | POA: Diagnosis not present

## 2023-01-30 LAB — POCT URINALYSIS DIP (MANUAL ENTRY)
Bilirubin, UA: NEGATIVE
Glucose, UA: NEGATIVE mg/dL
Ketones, POC UA: NEGATIVE mg/dL
Nitrite, UA: NEGATIVE
Protein Ur, POC: NEGATIVE mg/dL
Spec Grav, UA: >=1.03 — AB (ref 1.010–1.025)
Urobilinogen, UA: 0.2 E.U./dL
pH, UA: 5.5 (ref 5.0–8.0)

## 2023-01-30 MED ORDER — CEPHALEXIN 500 MG PO CAPS: 500.0000 mg | ORAL_CAPSULE | Freq: Two times a day (BID) | ORAL | 0 refills | Status: DC

## 2023-01-30 NOTE — Discharge Instructions (Signed)
You have had labs (urine culture) sent today. We will call you with any significant abnormalities or if there is need to begin or change treatment or pursue further follow up.  You may also review your test results online through MyChart. If you do not have a MyChart account, instructions to sign up should be on your discharge paperwork.  

## 2023-01-30 NOTE — ED Provider Notes (Addendum)
[facility]    ASSESSMENT & PLAN:  1. Dysuria   2. Cystitis    Begin: Meds ordered this encounter  Medications   cephALEXin (KEFLEX) 500 MG capsule    Sig: Take 1 capsule (500 mg total) by mouth 2 (two) times daily.    Dispense:  10 capsule    Refill:  0   No signs of pyelonephritis. Urine culture sent. Vaginal cytology for BV/yeast sent. Will notify patient of any significant results. Will follow up with her PCP or here if not showing improvement over the next 48 hours, sooner if needed.  Results for orders placed or performed during the hospital encounter of 01/30/23  POCT urinalysis dipstick  Result Value Ref Range   Color, UA yellow yellow   Clarity, UA clear clear   Glucose, UA negative negative mg/dL   Bilirubin, UA negative negative   Ketones, POC UA negative negative mg/dL   Spec Grav, UA >=7.829 (A) 1.010 - 1.025   Blood, UA small (A) negative   pH, UA 5.5 5.0 - 8.0   Protein Ur, POC negative negative mg/dL   Urobilinogen, UA 0.2 0.2 or 1.0 E.U./dL   Nitrite, UA Negative Negative   Leukocytes, UA Trace (A) Negative   v  Outlined signs and symptoms indicating need for more acute intervention. Patient verbalized understanding. After Visit Summary given.  SUBJECTIVE:  Shelly Williams is a 42 y.o. female who complains of urinary frequency, urgency and dysuria for the past sev days; worse over past few days. Without associated flank pain, fever, chills, vaginal discharge or bleeding. Gross hematuria: not present. No specific aggravating or alleviating factors reported. No LE edema. Normal PO intake without n/v/d. Without specific abdominal pain. Ambulatory without difficulty. No tx PTA. Denies concern over STI.  LMP: Patient's last menstrual period was 01/14/2023 (exact date).  OBJECTIVE:  Vitals:   01/30/23 0847  BP: 127/74  Pulse: 73  Resp: 20  Temp: 97.7 F (36.5 C)  TempSrc: Oral  SpO2: 98%   General appearance: alert; no  distress HENT: oropharynx: moist Lungs: unlabored respirations Abdomen: soft Back: no CVA tenderness Extremities: no edema; symmetrical with no gross deformities Skin: warm and dry Neurologic: normal gait Psychological: alert and cooperative; normal mood and affect  Labs Reviewed  POCT URINALYSIS DIP (MANUAL ENTRY) - Abnormal; Notable for the following components:      Result Value   Spec Grav, UA >=1.030 (*)    Blood, UA small (*)    Leukocytes, UA Trace (*)    All other components within normal limits  URINE CULTURE    No Known Allergies  Past Medical History:  Diagnosis Date   Abdominal pain 2014   Depression    Gallstones 2014   History of kidney stones    Social History   Socioeconomic History   Marital status: Married    Spouse name: Not on file   Number of children: 4   Years of education: Not on file   Highest education level: Not on file  Occupational History   Not on file  Tobacco Use   Smoking status: Never   Smokeless tobacco: Never  Vaping Use   Vaping Use: Never used  Substance and Sexual Activity   Alcohol use: No   Drug use: No   Sexual activity: Yes    Partners: Male    Birth control/protection: Surgical  Other Topics Concern   Not on file  Social History Narrative   Caffiene; rare, no soda  Working [employer]   Social Determinants of Corporate investment banker Strain: Not on [employer] Insecurity: Not on file  Transportation Needs: Not on file  Physical Activity: Not on file  Stress: Not on file  Social Connections: Not on file  Intimate Partner Violence: Not on file   Family History  Problem Relation Age of Onset   Lung cancer Maternal Aunt        lung   Cancer Other        lung - maternal great aunt   Breast cancer Maternal Aunt        breast   Healthy Mother    Healthy Father    Healthy Sister    Healthy Brother    Healthy Daughter    Healthy Son    Healthy Daughter    Healthy Daughter    Colon cancer Neg Hx     Ovarian cancer Neg Hx         Mardella Layman, MD 01/30/23 1610    Mardella Layman, MD 01/30/23 [phone]

## 2023-01-30 NOTE — ED Triage Notes (Signed)
Pt reports she has itching, burning, and frequent urination in her vagina x 1 week. When she walks it feels like she is itching inside of her vagina.

## 2023-01-31 LAB — CERVICOVAGINAL ANCILLARY ONLY
Bacterial Vaginitis (gardnerella): POSITIVE — AB
Candida Glabrata: NEGATIVE
Candida Vaginitis: NEGATIVE
Comment: NEGATIVE
Comment: NEGATIVE
Comment: NEGATIVE

## 2023-01-31 LAB — URINE CULTURE: Culture: <10000 — AB

## 2023-02-01 ENCOUNTER — Telehealth: Payer: Self-pay | Admitting: Emergency Medicine

## 2023-02-01 MED ORDER — METRONIDAZOLE 500 MG PO TABS: 500.0000 mg | ORAL_TABLET | Freq: Two times a day (BID) | ORAL | 0 refills | Status: DC

## 2023-02-05 ENCOUNTER — Encounter: Payer: Self-pay | Admitting: Family Medicine

## 2023-02-05 ENCOUNTER — Ambulatory Visit (INDEPENDENT_AMBULATORY_CARE_PROVIDER_SITE_OTHER): Payer: Medicaid Other | Admitting: Family Medicine

## 2023-02-05 VITALS — BP 92/62 | HR 78 | Temp 98.0°F | Resp 12 | Ht 62.0 in | Wt 220.0 lb

## 2023-02-05 DIAGNOSIS — F331 Major depressive disorder, recurrent, moderate: Secondary | ICD-10-CM | POA: Diagnosis not present

## 2023-02-05 DIAGNOSIS — Z6841 Body Mass Index (BMI) 40.0 and over, adult: Secondary | ICD-10-CM | POA: Diagnosis not present

## 2023-02-05 DIAGNOSIS — N9089 Other specified noninflammatory disorders of vulva and perineum: Secondary | ICD-10-CM | POA: Diagnosis not present

## 2023-02-05 MED ORDER — SERTRALINE HCL 50 MG PO TABS: 50.0000 mg | ORAL_TABLET | Freq: Every day | ORAL | 3 refills | Status: DC

## 2023-02-05 NOTE — Assessment & Plan Note (Signed)
Discussed importance of healthy weight management Discussed diet and exercise Considr addition of wellbutrin at next visit Discussed lack of insurance coverage for GLP1s

## 2023-02-05 NOTE — Assessment & Plan Note (Signed)
Chronic and uncontrolled More weight gain from prozac - will d/c (she already stopped one month ago) Trial of zoloft instead - more weight neutral Will Start Zoloft 50 mg daily Discussed potential side effects Discussed that it can take 6-8 weeks to reach full efficacy Contracted for safety - no SI/HI Discussed synergistic effects of medications and therapy

## 2023-02-05 NOTE — Assessment & Plan Note (Signed)
Discussed benign nature of skin tag Patient wants it removed due to appearance Discussed this is typically notcovered by insurance but gave her CPT code to check on coverage and cost

## 2023-02-05 NOTE — Progress Notes (Signed)
I,Shelly Williams,acting as a Neurosurgeon for Shelly Latch, MD.,have documented all relevant documentation on the behalf of Shelly Latch, MD,as directed by  Shelly Latch, MD while in the presence of Shelly Latch, MD.     Established patient visit   Patient: Shelly Williams   DOB: May 05, 1981   42 y.o. Female  MRN: [id number] Visit Date: 02/05/2023  Today's healthcare provider: Shirlee Latch, MD   Chief Complaint  Patient presents with   Vaginal Itching   Depression   Obesity   Subjective    HPI  Follow up for bacteria vaginitis  The patient was last seen for this 1 weeks ago at urgent care. Changes made at last visit include start metronidazole.  She reports excellent compliance with treatment. She feels that condition is Improved.  She  having side effects. Patient reports she has a lesion on vaginal area x 1 year. She reports lesion is growing and feels like she is getting another one right beside it. Looked like a pimple - this was not evaluated at UC ----------------------------------------------------------------------------------------- Depression, Follow-up  She  was last seen for this 4 months ago. Changes made at last visit include continue prozac 20 mg capsule.   She reports fair compliance with treatment. She reports she has been without medication for about one month. She is not having side effects.   She reports excellent tolerance of treatment. Current symptoms include: depressed mood, difficulty concentrating, fatigue, feelings of worthlessness/guilt, hopelessness, insomnia, and weight gain She feels she is Worse since last visit.  Some days are good and some days are bad Was gaining weight     02/05/2023   11:20 AM 09/25/2022   10:47 AM 07/23/2022   10:54 AM  Depression screen PHQ 2/9  Decreased Interest 1 0 2  Down, Depressed, Hopeless 1 0 1  PHQ - 2 Score 2 0 3  Altered sleeping 1 0 1  Tired, decreased energy 1 0 2  Change  in appetite 2 1 2   Feeling bad or failure about yourself  2 0 1  Trouble concentrating 0 0 1  Moving slowly or fidgety/restless 0 0 1  Suicidal thoughts 0 0 1  PHQ-9 Score 8 1 12   Difficult doing work/chores Somewhat difficult Not difficult at all Somewhat difficult    -----------------------------------------------------------------------------------------    Medications: Outpatient Medications Prior to Visit  Medication Sig   ALPRAZolam (XANAX) 0.5 MG tablet Take 0.5 mg by mouth at bedtime as needed.   metroNIDAZOLE (FLAGYL) 500 MG tablet Take 1 tablet (500 mg total) by mouth 2 (two) times daily.   SUMAtriptan (IMITREX) 50 MG tablet Take 1 tablet (50 mg total) by mouth every 2 (two) hours as needed for migraine. May repeat in 2 hours if headache persists or recurs.   [DISCONTINUED] cephALEXin (KEFLEX) 500 MG capsule Take 1 capsule (500 mg total) by mouth 2 (two) times daily.   [DISCONTINUED] FLUoxetine (PROZAC) 20 MG capsule Take 20 mg by mouth daily. (Patient not taking: Reported on 02/05/2023)   No facility-administered medications prior to visit.    Review of Systems per HPI     Objective    BP 92/62 (BP Location: Left Arm, Patient Position: Sitting, Cuff Size: Large)   Pulse 78   Temp 98 F (36.7 C) (Temporal)   Resp 12   Ht 5\' 2"  (1.575 m)   Wt 220 lb (99.8 kg)   LMP 01/14/2023 (Exact Date)   BMI 40.24 kg/m    Physical Exam Vitals reviewed.  Constitutional:      General: She is not in acute distress.    Appearance: Normal appearance. She is well-developed. She is not diaphoretic.  HENT:     Head: Normocephalic and atraumatic.  Eyes:     General: No scleral icterus.    Conjunctiva/sclera: Conjunctivae normal.  Neck:     Thyroid: No thyromegaly.  Cardiovascular:     Rate and Rhythm: Normal rate and regular rhythm.     Pulses: Normal pulses.     Heart sounds: Normal heart sounds. No murmur heard. Pulmonary:     Effort: Pulmonary effort is normal. No  respiratory distress.     Breath sounds: Normal breath sounds. No wheezing, rhonchi or rales.  Genitourinary:    General: Normal vulva.     Comments: Skin tag on L vulva between groin and labia Musculoskeletal:     Cervical back: Neck supple.     Right lower leg: No edema.     Left lower leg: No edema.  Lymphadenopathy:     Cervical: No cervical adenopathy.  Skin:    General: Skin is warm and dry.     Findings: No rash.  Neurological:     Mental Status: She is alert and oriented to person, place, and time. Mental status is at baseline.  Psychiatric:        Mood and Affect: Mood is depressed. Affect is flat.        Behavior: Behavior normal.       No results found for any visits on 02/05/23.  Assessment & Plan     Problem List Items Addressed This Visit       Musculoskeletal and Integument   Skin tag of vulva    Discussed benign nature of skin tag Patient wants it removed due to appearance Discussed this is typically notcovered by insurance but gave her CPT code to check on coverage and cost        Other   MDD (major depressive disorder) - Primary    Chronic and uncontrolled More weight gain from prozac - will d/c (she already stopped one month ago) Trial of zoloft instead - more weight neutral Will Start Zoloft 50 mg daily Discussed potential side effects Discussed that it can take 6-8 weeks to reach full efficacy Contracted for safety - no SI/HI Discussed synergistic effects of medications and therapy       Relevant Medications   ALPRAZolam (XANAX) 0.5 MG tablet   sertraline (ZOLOFT) 50 MG tablet   Morbid obesity with BMI of 40.0-44.9, adult (HCC)    Discussed importance of healthy weight management Discussed diet and exercise Considr addition of wellbutrin at next visit Discussed lack of insurance coverage for GLP1s        Return in about 2 months (around 04/07/2023) for MDD/GAD f/u, weight f/u, virtual ok.      I, Shelly Latch, MD, have reviewed  all documentation for this visit. The documentation on 02/05/23 for the exam, diagnosis, procedures, and orders are all accurate and complete.   Jadakiss Barish, Marzella Schlein, MD, MPH [facility]

## 2023-02-27 ENCOUNTER — Other Ambulatory Visit: Payer: Self-pay | Admitting: Family Medicine

## 2023-02-27 NOTE — Telephone Encounter (Signed)
Requested Prescriptions  Pending Prescriptions Disp Refills   sertraline (ZOLOFT) 50 MG tablet [Pharmacy Med Name: SERTRALINE HCL 50 MG TABLET] 90 tablet 2    Sig: TAKE 1 TABLET BY MOUTH EVERY DAY     Psychiatry:  Antidepressants - SSRI - sertraline Passed - 02/27/2023  8:30 AM      Passed - AST in normal range and within 360 days    AST  Date Value Ref Range Status  07/23/2022 20 0 - 40 IU/L Final         Passed - ALT in normal range and within 360 days    ALT  Date Value Ref Range Status  07/23/2022 21 0 - 32 IU/L Final         Passed - Completed PHQ-2 or PHQ-9 in the last 360 days      Passed - Valid encounter within last 6 months    Recent Outpatient Visits           3 weeks ago Moderate episode of recurrent major depressive disorder [facility])   [facility] [place], Marzella Schlein, MD   5 months ago Moderate episode of recurrent major depressive disorder [facility])   [place] [place], Marzella Schlein, MD   7 months ago Moderate episode of recurrent major depressive disorder [facility])   [facility] [place], Marzella Schlein, MD   9 months ago Unexplained weight gain   [facility] Malva Limes, MD   1 year ago Lower abdominal pain   [facility] Malva Limes, MD

## 2023-03-08 NOTE — Progress Notes (Deleted)
   I,Martice Doty S Amarissa Koerner,acting as a Neurosurgeon for Shirlee Latch, MD.,have documented all relevant documentation on the behalf of Shirlee Latch, MD,as directed by  Shirlee Latch, MD while in the presence of Shirlee Latch, MD.     Established patient visit   Patient: Shelly Williams   DOB: 08-27-1981   42 y.o. Female  MRN: [id number] Visit Date: 03/11/2023  Today's healthcare provider: Shirlee Latch, MD   No chief complaint on file.  Subjective    HPI  ***  Medications: Outpatient Medications Prior to Visit  Medication Sig   ALPRAZolam (XANAX) 0.5 MG tablet Take 0.5 mg by mouth at bedtime as needed.   metroNIDAZOLE (FLAGYL) 500 MG tablet Take 1 tablet (500 mg total) by mouth 2 (two) times daily.   sertraline (ZOLOFT) 50 MG tablet Take 1 tablet (50 mg total) by mouth daily.   SUMAtriptan (IMITREX) 50 MG tablet Take 1 tablet (50 mg total) by mouth every 2 (two) hours as needed for migraine. May repeat in 2 hours if headache persists or recurs.   No facility-administered medications prior to visit.    Review of Systems  {Labs  Heme  Chem  Endocrine  Serology  Results Review (optional):23779}   Objective    There were no vitals taken for this visit. {Show previous vital signs (optional):23777}  Physical Exam  ***  No results found for any visits on 03/11/23.  Assessment & Plan     ***  No follow-ups on file.      {provider attestation***:1}   Shirlee Latch, MD  [facility] [phone] (phone) [phone] (fax)  [facility]

## 2023-03-11 ENCOUNTER — Ambulatory Visit: Payer: Medicaid Other | Admitting: Family Medicine

## 2023-03-11 ENCOUNTER — Encounter: Payer: Self-pay | Admitting: Family Medicine

## 2023-03-11 VITALS — BP 98/66 | HR 80 | Temp 97.8°F | Resp 12 | Wt 222.0 lb

## 2023-03-11 DIAGNOSIS — N9089 Other specified noninflammatory disorders of vulva and perineum: Secondary | ICD-10-CM | POA: Diagnosis not present

## 2023-03-11 DIAGNOSIS — L853 Xerosis cutis: Secondary | ICD-10-CM

## 2023-03-11 NOTE — Progress Notes (Signed)
I,Sulibeya S Dimas,acting as a Neurosurgeon for Shirlee Latch, MD.,have documented all relevant documentation on the behalf of Shirlee Latch, MD,as directed by  Shirlee Latch, MD while in the presence of Shirlee Latch, MD.     Established patient visit   Patient: Shelly Williams   DOB: [birth year]   42 y.o. Female  MRN: [id number] Visit Date: 03/11/2023  Today's healthcare provider: Shirlee Latch, MD   Chief Complaint  Patient presents with   Vaginal Itching   Subjective    Vaginal Itching The patient's primary symptoms include genital itching. The patient's pertinent negatives include no genital lesions, genital odor, genital rash or vaginal discharge. This is a new problem. Episode onset: 3 weeks. The problem occurs constantly. The problem has been gradually worsening. The problem affects both sides. She is not pregnant. Pertinent negatives include no chills, dysuria, fever, hematuria or rash. Nothing aggravates the symptoms. Treatments tried: vagisil. The treatment provided no relief.  patient denies any rash. She reports over the weekend having itching all over her body.    Discussed the use of AI scribe software for clinical note transcription with the patient, who gave verbal consent to proceed.  History of Present Illness   The patient presents with a three-week history of vaginal itching and a more recent onset of generalized itching since the previous Friday. The itching is described as intense and constant, occurring five to six times a day. The patient also reports a sensation of dryness in the vaginal area. There is no associated rash, fever, or other symptoms. The patient has tried over-the-counter Vagisil cream for the vaginal itching, which did not provide relief, but found that ice helped to cool the area. There are no urinary symptoms, such as burning during urination. The patient has not introduced any new soaps, detergents, perfumes, or lotions. The patient  also reports dry skin all over the body.       Medications: Outpatient Medications Prior to Visit  Medication Sig   ALPRAZolam (XANAX) 0.5 MG tablet Take 0.5 mg by mouth at bedtime as needed. (Patient not taking: Reported on 03/11/2023)   metroNIDAZOLE (FLAGYL) 500 MG tablet Take 1 tablet (500 mg total) by mouth 2 (two) times daily. (Patient not taking: Reported on 03/11/2023)   sertraline (ZOLOFT) 50 MG tablet Take 1 tablet (50 mg total) by mouth daily.   SUMAtriptan (IMITREX) 50 MG tablet Take 1 tablet (50 mg total) by mouth every 2 (two) hours as needed for migraine. May repeat in 2 hours if headache persists or recurs.   No facility-administered medications prior to visit.    Review of Systems  Constitutional:  Negative for chills and fever.  Respiratory:  Negative for cough and shortness of breath.   Cardiovascular:  Negative for chest pain.  Genitourinary:  Negative for dysuria, genital sores, hematuria and vaginal discharge.  Skin:  Negative for color change and rash.       Objective    BP 98/66 (BP Location: Left Arm, Patient Position: Sitting, Cuff Size: Large)   Pulse 80   Temp 97.8 F (36.6 C) (Temporal)   Resp 12   Wt 222 lb (100.7 kg)   LMP 03/10/2023 Comment: very light and one day only  BMI 40.60 kg/m  BP Readings from Last 3 Encounters:  03/11/23 98/66  02/05/23 92/62  01/30/23 127/74   Wt Readings from Last 3 Encounters:  03/11/23 222 lb (100.7 kg)  02/05/23 220 lb (99.8 kg)  09/25/22 204 lb 6.4 oz (92.7 kg)  Physical Exam  Physical Exam   SKIN: No rash observed, skin dry. Redness noted bilaterally on the external genitalia without discharge or bumps.       No results found for any visits on 03/11/23.  Assessment & Plan     Assessment and Plan    Vulvar Pruritus: Persistent itching of the vulva for three weeks with no discharge or rash. Failed over-the-counter Vagisil. Noted to have redness on examination. -Apply Hydrocortisone cream twice  daily. -Apply Vaseline a couple of times a day. -Consider changing brand of sanitary pads.  Generalized Pruritus: Recent onset of generalized itching without rash. Skin noted to be dry on examination. -Apply Aveeno Eczema Therapy cream. -Apply Hydrocortisone cream to particularly itchy areas. -Consider changing soap to a sensitive skin variant.        Return if symptoms worsen or fail to improve.      I, Shirlee Latch, MD, have reviewed all documentation for this visit. The documentation on 03/11/23 for the exam, diagnosis, procedures, and orders are all accurate and complete.   Kristi Norment, Marzella Schlein, MD, MPH [facility]

## 2023-04-03 ENCOUNTER — Other Ambulatory Visit: Payer: Self-pay | Admitting: Family Medicine

## 2023-05-10 ENCOUNTER — Encounter: Payer: Self-pay | Admitting: Family Medicine

## 2023-05-10 ENCOUNTER — Ambulatory Visit: Payer: Medicaid Other | Admitting: Family Medicine

## 2023-05-10 VITALS — BP 97/72 | HR 77 | Temp 98.6°F | Ht 62.0 in | Wt 220.0 lb

## 2023-05-10 DIAGNOSIS — N3091 Cystitis, unspecified with hematuria: Secondary | ICD-10-CM

## 2023-05-10 DIAGNOSIS — R29818 Other symptoms and signs involving the nervous system: Secondary | ICD-10-CM | POA: Diagnosis not present

## 2023-05-10 DIAGNOSIS — G43009 Migraine without aura, not intractable, without status migrainosus: Secondary | ICD-10-CM

## 2023-05-10 DIAGNOSIS — G500 Trigeminal neuralgia: Secondary | ICD-10-CM

## 2023-05-10 DIAGNOSIS — M5432 Sciatica, left side: Secondary | ICD-10-CM

## 2023-05-10 LAB — POCT URINALYSIS DIPSTICK
Bilirubin, UA: NEGATIVE
Blood, UA: POSITIVE
Glucose, UA: NEGATIVE
Ketones, UA: NEGATIVE
Nitrite, UA: POSITIVE
Protein, UA: NEGATIVE
Spec Grav, UA: 1.02 (ref 1.010–1.025)
Urobilinogen, UA: 0.2 E.U./dL
pH, UA: 6 (ref 5.0–8.0)

## 2023-05-10 MED ORDER — CEPHALEXIN 500 MG PO CAPS: 500.0000 mg | ORAL_CAPSULE | Freq: Three times a day (TID) | ORAL | 0 refills | Status: AC

## 2023-05-10 MED ORDER — MELOXICAM 15 MG PO TABS: 15.0000 mg | ORAL_TABLET | Freq: Every day | ORAL | 0 refills | Status: DC

## 2023-05-10 NOTE — Progress Notes (Signed)
Acute Office Visit  Subjective:     Patient ID: Shelly Williams, female    DOB: August 14, 1981, 42 y.o.   MRN: [id number]  Chief Complaint  Patient presents with   Vaginal Discomfort    Vaginal burning and some itching for about 1 month.  No discharge.  Patient notes that she is unable to tell when she is urinating.  She states she does not feel a sensation to urinate she only and doesn't know she has urinated until she feels burning and then realizes she has urinated on herself.  She states she used Cortisone cream and got some relief but then it came back.    HPI Discussed the use of AI scribe software for clinical note transcription with the patient, who gave verbal consent to proceed.  History of Present Illness   A 42 year old patient with a history of vulvar irritation presents for a follow-up visit. The patient was previously advised to use hydrocortisone and Vaseline and to change her brand of sanitary pads. The patient reports that these measures were effective in resolving the vulvar irritation.  However, the patient now reports new symptoms of urinary incontinence and a burning sensation during urination. She describes an unusual lack of sensation to urinate, followed by an urgent need to urinate accompanied by burning. This has led to episodes of incontinence. These symptoms have been present for a week.  In addition to the urinary symptoms, the patient also reports a week-long episode of numbness in her right hand and face. The numbness in the hand lasted for about two hours and then resolved. Later, she experienced numbness in her face, similar to the sensation after a dental procedure. The patient denies any associated headache or speech changes during these episodes.  The patient also reports pain in her hips and tailbone. The pain in the tailbone has resolved, but she now experiences pain in her left buttock. The pain is severe enough to prevent her from sitting for prolonged  periods and disrupts her sleep. She describes an episode where the pain radiated down to her foot, causing significant discomfort.       ROS per HPI      Objective:    BP 97/72 (BP Location: Right Arm, Patient Position: Sitting, Cuff Size: Large)   Pulse 77   Temp 98.6 F (37 C) (Oral)   Ht 5\' 2"  (1.575 m)   Wt 220 lb (99.8 kg)   SpO2 97%   BMI 40.24 kg/m    Physical Exam Vitals reviewed.  Constitutional:      General: She is not in acute distress.    Appearance: She is well-developed.  HENT:     Head: Normocephalic and atraumatic.  Eyes:     General: No scleral icterus.    Conjunctiva/sclera: Conjunctivae normal.  Cardiovascular:     Rate and Rhythm: Normal rate and regular rhythm.     Heart sounds: Normal heart sounds. No murmur heard. Pulmonary:     Effort: Pulmonary effort is normal. No respiratory distress.     Breath sounds: Normal breath sounds. No wheezing or rales.  Abdominal:     General: There is no distension.     Palpations: Abdomen is soft.     Tenderness: There is abdominal tenderness in the suprapubic area. There is no guarding or rebound.  Skin:    General: Skin is warm and dry.     Capillary Refill: Capillary refill takes less than 2 seconds.     Findings: No  rash.  Neurological:     Mental Status: She is alert and oriented to person, place, and time.  Psychiatric:        Behavior: Behavior normal.     Results for orders placed or performed in visit on 05/10/23  POCT urinalysis dipstick  Result Value Ref Range   Color, UA yellow    Clarity, UA cloudy    Glucose, UA Negative Negative   Bilirubin, UA neg    Ketones, UA neg    Spec Grav, UA 1.020 1.010 - 1.025   Blood, UA pos    pH, UA 6.0 5.0 - 8.0   Protein, UA Negative Negative   Urobilinogen, UA 0.2 0.2 or 1.0 E.U./dL   Nitrite, UA pos    Leukocytes, UA Small (1+) (A) Negative   Appearance     Odor          Assessment & Plan:   Problem List Items Addressed This Visit        Cardiovascular and Mediastinum   Migraine without aura and without status migrainosus, not intractable   Relevant Medications   meloxicam (MOBIC) 15 MG tablet   Other Relevant Orders   Ambulatory referral to Neurology     Nervous and Auditory   Trigeminal neuralgia of left side of face   Relevant Orders   Ambulatory referral to Neurology     Other   Transient neurological symptoms   Relevant Orders   Ambulatory referral to Neurology   Other Visit Diagnoses     Cystitis with hematuria    -  Primary   Relevant Orders   Urine Culture   Urinalysis, microscopic only   POCT urinalysis dipstick (Completed)   Sciatica of left side          Assessment and Plan    Urinary Tract Infection New onset urinary urgency, incontinence, and dysuria. Urinalysis shows pyuria, hematuria, and cloudy urine. -Start Keflex TID for 5 days. -Send urine for culture and sensitivity. -Check microscopic urinalysis to confirm hematuria. -Recheck urine in 6 weeks to ensure resolution of hematuria.  Transient Neurologic Symptoms New onset transient right arm and facial numbness with associated dizziness. No associated headache, speech changes, or vision changes. History of similar episode with different symptoms several years ago. -Refer to Neurology for further evaluation - discussed emergent precautions for stroke like symptoms   Sciatica New onset left-sided sciatica with pain radiating down to the foot. Pain is worse with sitting and lying on the affected side. -Start Meloxicam 15mg  daily. -Advise patient on sciatica exercises.  General Health Maintenance / Followup Plans -Schedule physical exam in a couple of months. -Plan for flu shot and COVID booster when available. -Plan for Hepatitis screening at the time of physical exam.        Meds ordered this encounter  Medications   cephALEXin (KEFLEX) 500 MG capsule    Sig: Take 1 capsule (500 mg total) by mouth 3 (three) times daily for 5  days.    Dispense:  15 capsule    Refill:  0   meloxicam (MOBIC) 15 MG tablet    Sig: Take 1 tablet (15 mg total) by mouth daily.    Dispense:  30 tablet    Refill:  0    Return in about 2 months (around 07/10/2023) for CPE.  Shirlee Latch, MD

## 2023-06-15 ENCOUNTER — Other Ambulatory Visit: Payer: Self-pay | Admitting: Family Medicine

## 2023-07-19 ENCOUNTER — Other Ambulatory Visit: Payer: Self-pay | Admitting: Family Medicine

## 2023-07-22 NOTE — Telephone Encounter (Signed)
Requested medications are due for refill today.  yes  Requested medications are on the active medications list.  yes  Last refill. 06/17/2023 #30 0 rf  Future visit scheduled.   no  Notes to clinic.   Labs expire 07/24/2023    Requested Prescriptions  Pending Prescriptions Disp Refills   meloxicam (MOBIC) 15 MG tablet [Pharmacy Med Name: MELOXICAM 15 MG TABLET] 30 tablet 0    Sig: TAKE 1 TABLET (15 MG TOTAL) BY MOUTH DAILY.     Analgesics:  COX2 Inhibitors Failed - 07/19/2023  6:48 PM      Failed - Manual Review: Labs are only required if the patient has taken medication for more than 8 weeks.      Failed - HGB in normal range and within 360 days    Hemoglobin  Date Value Ref Range Status  07/23/2022 12.9 11.1 - 15.9 g/dL Final         Failed - Cr in normal range and within 360 days    Creatinine  Date Value Ref Range Status  12/08/2012 0.82 0.60 - 1.30 mg/dL Final   Creatinine, Ser  Date Value Ref Range Status  07/23/2022 0.60 0.57 - 1.00 mg/dL Final         Failed - HCT in normal range and within 360 days    Hematocrit  Date Value Ref Range Status  07/23/2022 39.8 34.0 - 46.6 % Final         Failed - AST in normal range and within 360 days    AST  Date Value Ref Range Status  07/23/2022 20 0 - 40 IU/L Final         Failed - ALT in normal range and within 360 days    ALT  Date Value Ref Range Status  07/23/2022 21 0 - 32 IU/L Final         Failed - eGFR is 30 or above and within 360 days    EGFR (African American)  Date Value Ref Range Status  12/08/2012 >60  Final   GFR calc Af Amer  Date Value Ref Range Status  01/28/2020 136 >59 mL/min/1.73 Final   EGFR (Non-African Amer.)  Date Value Ref Range Status  12/08/2012 >60  Final    Comment:    eGFR values <47mL/min/1.73 m2 may be an indication of chronic kidney disease (CKD). Calculated eGFR is useful in patients with stable renal function. The eGFR calculation will not be reliable in acutely ill  patients when serum creatinine is changing rapidly. It is not useful in  patients on dialysis. The eGFR calculation may not be applicable to patients at the low and high extremes of body sizes, pregnant women, and vegetarians.    GFR calc non Af Amer  Date Value Ref Range Status  01/28/2020 118 >59 mL/min/1.73 Final   eGFR  Date Value Ref Range Status  07/23/2022 116 >59 mL/min/1.73 Final         Passed - Patient is not pregnant      Passed - Valid encounter within last 12 months    Recent Outpatient Visits           2 months ago Cystitis with hematuria   [facility] [facility] [place], Marzella Schlein, MD   4 months ago Vulvar irritation   [facility] Erasmo Downer, MD   5 months ago Moderate episode of recurrent major depressive disorder [facility])    [facility] [place], Marylene Land  M, MD   10 months ago Moderate episode of recurrent major depressive disorder [facility])   [place] [place], Marzella Schlein, MD   12 months ago Moderate episode of recurrent major depressive disorder [facility])   [place] [facility] [place], Marzella Schlein, MD

## 2023-10-21 ENCOUNTER — Ambulatory Visit: Payer: Medicaid Other | Admitting: Family Medicine

## 2023-10-28 ENCOUNTER — Ambulatory Visit: Payer: Medicaid Other | Admitting: Family Medicine

## 2023-12-09 ENCOUNTER — Ambulatory Visit: Payer: Self-pay | Admitting: Family Medicine

## 2023-12-09 NOTE — Telephone Encounter (Signed)
 1st attempt to return call: "Call cannot be completed as dialed"

## 2023-12-09 NOTE — Telephone Encounter (Signed)
 2nd attempt to call patient-recording of "Call cannot be completed as dialed"  Summary: left knee pain, vaginal itching and burning   Copied From CRM [id number]. Reason for Triage: left knee pain, vaginal itching and burning (1 week)

## 2023-12-10 ENCOUNTER — Ambulatory Visit: Payer: Self-pay

## 2023-12-11 ENCOUNTER — Ambulatory Visit: Payer: Self-pay

## 2023-12-16 ENCOUNTER — Encounter: Payer: Self-pay | Admitting: Family Medicine

## 2023-12-16 ENCOUNTER — Ambulatory Visit: Admitting: Family Medicine

## 2023-12-16 VITALS — BP 120/78 | HR 65 | Ht 62.0 in | Wt 226.0 lb

## 2023-12-16 DIAGNOSIS — N952 Postmenopausal atrophic vaginitis: Secondary | ICD-10-CM | POA: Diagnosis not present

## 2023-12-16 DIAGNOSIS — G8929 Other chronic pain: Secondary | ICD-10-CM | POA: Diagnosis not present

## 2023-12-16 DIAGNOSIS — M25562 Pain in left knee: Secondary | ICD-10-CM

## 2023-12-16 DIAGNOSIS — N9089 Other specified noninflammatory disorders of vulva and perineum: Secondary | ICD-10-CM | POA: Diagnosis not present

## 2023-12-16 MED ORDER — ESTRADIOL 0.1 MG/GM VA CREA: 1.0000 | TOPICAL_CREAM | VAGINAL | 12 refills | Status: DC

## 2023-12-16 NOTE — Progress Notes (Signed)
 Acute visit   Patient: Shelly Williams   DOB: 12/27/1980   43 y.o. Female  MRN: [id number] PCP: Erasmo Downer, MD   Chief Complaint  Patient presents with   Knee Pain    Left knee pain intermittent. Started last week and sounds like a crack. Patient is taking tylenol for the pain and it does help   Vaginal Itching    Vaginal itching X 2 weeks. Reports it has now stopped. Would like to discuss in case it returns as it has happened previously. Patient reports she used AD ointment for burning and cortisone for itching   Subjective    Discussed the use of AI scribe software for clinical note transcription with the patient, who gave verbal consent to proceed.  History of Present Illness   The patient, with a history of intermittent left knee pain, presents for evaluation. The knee pain is described as a cracking sensation followed by pain that lasts for a couple of days. The pain is intermittent and is worse when going up steps or using a stepladder. The patient reports a recent episode of knee pain about a week or ten days ago. Over-the-counter Tylenol provides relief. The patient denies any current knee pain. An X-ray of the knee taken in 2020 was reported as normal.  The patient also reports recurrent vaginal itching. The itching is described as intense and non-stop. The patient has been using cortisone cream and A&D ointment for relief, which seems to help. The patient also notes that her husband experiences a similar rash when she has these episodes. The patient denies any vaginal discharge or pain during urination.  The patient also mentions a decrease in sex drive and occasional urinary incontinence. The patient underwent surgery for urinary incontinence two years ago, which initially improved symptoms. However, the patient reports a recent increase in urgency and occasional leakage. The patient denies the need for pads.        Review of Systems  Objective    BP 120/78  (BP Location: Left Arm, Patient Position: Sitting)   Pulse 65   Ht 5\' 2"  (1.575 m)   Wt 226 lb (102.5 kg)   SpO2 100%   BMI 41.34 kg/m  Physical Exam Vitals reviewed.  Constitutional:      General: She is not in acute distress.    Appearance: She is well-developed.  HENT:     Head: Normocephalic and atraumatic.  Eyes:     General: No scleral icterus.    Conjunctiva/sclera: Conjunctivae normal.  Cardiovascular:     Rate and Rhythm: Normal rate and regular rhythm.  Pulmonary:     Effort: Pulmonary effort is normal. No respiratory distress.  Skin:    General: Skin is warm and dry.     Findings: No rash.  Neurological:     Mental Status: She is alert and oriented to person, place, and time.  Psychiatric:        Behavior: Behavior normal.      Physical Exam   MUSCULOSKELETAL: Left knee with normal alignment, no pain, no swelling. Strength is good, ligaments are strong and stable.       No results found for any visits on 12/16/23.  Assessment & Plan     Problem List Items Addressed This Visit       Other   Chronic pain of left knee   Relevant Orders   Ambulatory referral to Orthopedic Surgery   Other Visit Diagnoses  Vulvar irritation    -  Primary     Atrophic vaginitis               Left Knee Pain Intermittent left knee pain with episodes of cracking and mild pain, exacerbated by walking and steps. Previous x-ray in 2020 was normal. Physical exam today shows no pain, swelling, or instability. Differential includes patellofemoral pain syndrome. Patient prefers to see an orthopedist in [place]. - Refer to orthopedist in [place] for further evaluation  Urinary Incontinence Post-surgical urinary incontinence with recent urgency and occasional leakage x few months, possibly contributing to vaginal irritation. Patient prefers to see a gynecologist in [place]. - Call gynecologist for evaluation of urinary incontinence post-op  vulvar  irritation Recurrent vulvar itching without discharge, burning, or sores. Symptoms improve with A&D ointment but hydrocortisone causes burning. Differential includes contact dermatitis or recurrent HSV infection. Symptoms also affect her husband, raising suspicion for HSV. Advised to seek evaluation during next flare-up for possible HSV testing. - Use A&D ointment as needed - Seek evaluation during next flare-up for possible HSV testing  Vaginal Dryness Vaginal dryness and discomfort during intercourse, likely related to perimenopausal changes. Discussed use of estrogen cream to alleviate dryness and improve comfort during intercourse. Patient agreed to try estrogen cream. - Prescribe estrogen cream to be used intravaginally twice a week at bedtime  Follow-up - Schedule a physical exam in three months - Send prescription for estrogen cream to [facility] on [address] ordered this encounter  Medications   estradiol (ESTRACE VAGINAL) 0.1 MG/GM vaginal cream    Sig: Place 1 Applicatorful vaginally 2 (two) times a week. At bedtime    Dispense:  42.5 g    Refill:  12     Return in about 3 months (around 03/17/2024) for CPE.      Shirlee Latch, MD  [facility] [phone] (phone) [phone] (fax)  [facility]

## 2023-12-18 ENCOUNTER — Other Ambulatory Visit (HOSPITAL_COMMUNITY): Payer: Self-pay

## 2023-12-18 ENCOUNTER — Telehealth: Payer: Self-pay

## 2023-12-18 NOTE — Telephone Encounter (Signed)
 Pharmacy Patient Advocate Encounter   Received notification from Onbase that prior authorization for Estradiol 0.1MG /GM cream is required/requested.   Insurance verification completed.   The patient is insured through [facility] .   Per test claim: PA required; PA submitted to above mentioned insurance via CoverMyMeds Key/confirmation #/EOC [id number] Status is pending

## 2023-12-18 NOTE — Telephone Encounter (Signed)
 Pharmacy Patient Advocate Encounter  Received notification from [facility] Medicaid that Prior Authorization for Estradiol 0.1MG /GM cream has been DENIED.  Full denial letter will be uploaded to the media tab. See denial reason below.   PA #/Case ID/Reference #: [id number]

## 2023-12-18 NOTE — Telephone Encounter (Signed)
 FYI please see the Denied medication message below

## 2023-12-20 ENCOUNTER — Ambulatory Visit: Payer: Self-pay | Admitting: Family Medicine

## 2023-12-20 NOTE — Telephone Encounter (Signed)
  Chief Complaint: vomiting Symptoms: vomiting, fever, diarrhea Frequency: since last night Pertinent Negatives: Patient denies sob, abdominal pain, signs of dehydration Disposition: [] ED /[] Urgent Care (no appt availability in office) / [x] Appointment(In office/virtual)/ []  [facility]/ [] Home Care/ [x] Refused Recommended Disposition /[] [facility]/ []  Follow-up with PCP Additional Notes: Patient reports she has been experiencing fever, vomiting and diarrhea since last night. Patient reports she has had 2 episodes and that her temp this morning was 102F. Patient reports her daughter is currently experiencing the same symptoms and was told at [facility] that she has a virus. Per protocol, this RN attempted to schedule appt in office today. Offered patient the 2 available time slots and patient refused, stating that she was not able to make it into the office until after 5pm. Patient reports she will just go to [facility] after 5pm to be seen since she cannot make any of the available appts in office today. Advised patient to call back with worsening symptoms. Patient verbalized understanding.       Copied from CRM [id number]. Topic: Clinical - Red Word Triage >> Dec 20, 2023  8:44 AM [place] B wrote: [facility] that prompted transfer to Nurse Triage: pt has vomiting and temperature Reason for Disposition  [1] MILD or MODERATE vomiting AND [2] present > 48 hours (2 days) (Exception: Mild vomiting with associated diarrhea.)  Answer Assessment - Initial Assessment Questions 1. VOMITING SEVERITY: "How many times have you vomited in the past 24 hours?"     - MILD:  1 - 2 times/day    - MODERATE: 3 - 5 times/day, decreased oral intake without significant weight loss or symptoms of dehydration    - SEVERE: 6 or more times/day, vomits everything or nearly everything, with significant weight loss, symptoms of dehydration      mild 2. ONSET: "When did the vomiting begin?"      Last night 3. FLUIDS:  "What fluids or food have you vomited up today?" "Have you been able to keep any fluids down?"     water 4. ABDOMEN PAIN: "Are your having any abdomen pain?" If Yes : "How bad is it and what does it feel like?" (e.g., crampy, dull, intermittent, constant)      no 5. DIARRHEA: "Is there any diarrhea?" If Yes, ask: "How many times today?"      yes 6. CONTACTS: "Is there anyone else in the family with the same symptoms?"       7. CAUSE: "What do you think is causing your vomiting?"     unsure 8. HYDRATION STATUS: "Any signs of dehydration?" (e.g., dry mouth [not only dry lips], too weak to stand) "When did you last urinate?"     denies 9. OTHER SYMPTOMS: "Do you have any other symptoms?" (e.g., fever, headache, vertigo, vomiting blood or coffee grounds, recent head injury)     Diarrhea, fever  Protocols used: Vomiting-A-AH

## 2024-01-23 ENCOUNTER — Ambulatory Visit: Admitting: Family Medicine

## 2024-01-23 ENCOUNTER — Other Ambulatory Visit (HOSPITAL_COMMUNITY)
Admission: RE | Admit: 2024-01-23 | Discharge: 2024-01-23 | Disposition: A | Discharge status: Home / Self Care | Source: Ambulatory Visit | Attending: Family Medicine | Admitting: Family Medicine

## 2024-01-23 ENCOUNTER — Encounter: Payer: Self-pay | Admitting: Family Medicine

## 2024-01-23 VITALS — BP 102/61 | HR 71 | Ht 62.0 in | Wt 220.0 lb

## 2024-01-23 DIAGNOSIS — N9489 Other specified conditions associated with female genital organs and menstrual cycle: Secondary | ICD-10-CM

## 2024-01-23 DIAGNOSIS — R3989 Other symptoms and signs involving the genitourinary system: Secondary | ICD-10-CM

## 2024-01-23 DIAGNOSIS — N760 Acute vaginitis: Secondary | ICD-10-CM | POA: Diagnosis not present

## 2024-01-23 DIAGNOSIS — N898 Other specified noninflammatory disorders of vagina: Secondary | ICD-10-CM | POA: Diagnosis present

## 2024-01-23 DIAGNOSIS — M545 Low back pain, unspecified: Secondary | ICD-10-CM

## 2024-01-23 DIAGNOSIS — D72829 Elevated white blood cell count, unspecified: Secondary | ICD-10-CM

## 2024-01-23 DIAGNOSIS — B3731 Acute candidiasis of vulva and vagina: Secondary | ICD-10-CM | POA: Insufficient documentation

## 2024-01-23 DIAGNOSIS — B9689 Other specified bacterial agents as the cause of diseases classified elsewhere: Secondary | ICD-10-CM | POA: Diagnosis not present

## 2024-01-23 DIAGNOSIS — R319 Hematuria, unspecified: Secondary | ICD-10-CM

## 2024-01-23 LAB — POCT URINALYSIS DIPSTICK
Bilirubin, UA: NEGATIVE
Glucose, UA: NEGATIVE
Ketones, UA: NEGATIVE
Nitrite, UA: POSITIVE
Protein, UA: POSITIVE — AB
Spec Grav, UA: 1.01 (ref 1.010–1.025)
Urobilinogen, UA: 0.2 U/dL
pH, UA: 7.5 (ref 5.0–8.0)

## 2024-01-23 MED ORDER — NITROFURANTOIN MONOHYD MACRO 100 MG PO CAPS: 100.0000 mg | ORAL_CAPSULE | Freq: Two times a day (BID) | ORAL | 0 refills | Status: AC

## 2024-01-23 NOTE — Progress Notes (Signed)
 Acute visit   Patient: Shelly Williams   DOB: 03-29-1981   43 y.o. Female  MRN: [id number] PCP: Erasmo Downer, MD   Chief Complaint  Patient presents with   Vaginal Itching    Patient is present due to having systoms of itching/burning in the vaginal area X 3 days. Reports symptoms are constant. She reports associated with low back pain. Patient reports she has tried a&D ointment and cortisone with minor relief. Patient reports last used last night. NO recent treatment to report   Subjective    Discussed the use of AI scribe software for clinical note transcription with the patient, who gave verbal consent to proceed.  History of Present Illness   The patient presents with three days of vaginal itching, described as both internal and external. She has been using AD ointment internally and cortisone externally, which provides some relief, but the itching persists. She denies any new soaps, lotions, creams, or washes and maintains a gentle soap and water hygiene routine. She does not use pads or panty liners. The itching is associated with a burning sensation but no pain. She denies any vaginal discharge. She has noticed an increased frequency of urination but denies dysuria. This is the third episode of similar symptoms this year.        Review of Systems  Objective    BP 102/61 (BP Location: Right Arm, Patient Position: Sitting, Cuff Size: Normal)   Pulse 71   Ht 5\' 2"  (1.575 m)   Wt 220 lb (99.8 kg)   SpO2 100%   BMI 40.24 kg/m  Physical Exam Genitourinary:    Comments: GYN:  External genitalia within normal limits, except irritation of inside of labia majora.  Vaginal mucosa pink, moist, normal rugae.  Nonfriable cervix without lesions, small discharge, no bleeding noted on speculum exam.        Results for orders placed or performed in visit on 01/23/24  POCT Urinalysis Dipstick  Result Value Ref Range   Color, UA     Clarity, UA cloudy    Glucose, UA  Negative Negative   Bilirubin, UA negative    Ketones, UA negative    Spec Grav, UA 1.010 1.010 - 1.025   Blood, UA trace    pH, UA 7.5 5.0 - 8.0   Protein, UA Positive (A) Negative   Urobilinogen, UA 0.2 0.2 or 1.0 E.U./dL   Nitrite, UA positive    Leukocytes, UA Moderate (2+) (A) Negative   Appearance     Odor      Assessment & Plan     Problem List Items Addressed This Visit   None Visit Diagnoses       Suspected UTI    -  Primary   Relevant Orders   Urine Culture   Urinalysis, microscopic only     Vaginal burning       Relevant Orders   POCT Urinalysis Dipstick (Completed)   Cervicovaginal ancillary only     Itching in the vaginal area       Relevant Orders   POCT Urinalysis Dipstick (Completed)   Cervicovaginal ancillary only     Acute midline low back pain, unspecified whether sciatica present       Relevant Orders   POCT Urinalysis Dipstick (Completed)     Hematuria, unspecified type       Relevant Orders   Urinalysis, microscopic only     Leukocytosis, unspecified type       Relevant  Orders   Urine Culture   Urinalysis, microscopic only          Vulvovaginal irritation Presents with three days of pruritus and burning sensation both on the internal and external vulva. Erythema and irritation noted on the labia minora, with discharge present. Differential diagnosis includes candidiasis, bacterial vaginosis, or other causes of vulvovaginitis. - Perform a vaginal exam and swab to test for candidiasis, bacterial vaginosis, and other infections. - Discuss the use of A&D and hydrocortisone on the vulva, excluding intravaginal application.  Sexually Transmitted Infection (STI) screening Requested comprehensive STI testing, including gonorrhea and chlamydia, as part of the evaluation for vulvovaginal symptoms. - Perform STI testing for gonorrhea, chlamydia, and other relevant infections.  Urinary Tract Infection (UTI) Urinalysis shows leukocytes and nitrites,  indicating a possible UTI. Reports increased frequency of urination without dysuria. Previous UTI was caused by E. coli, which was pan-sensitive. Initiate antibiotic treatment with Macrobid due to its efficacy and convenient twice-daily dosing for five days. - Prescribe Macrobid (nitrofurantoin) for 5 days, twice daily. - Send urine culture to confirm the presence of bacteria and guide further treatment if necessary.  Follow-up Follow-up is necessary to review the results of the swab and urine culture to ensure appropriate treatment of the identified conditions. - Notify her of the swab and culture results when available. - Adjust treatment based on swab and culture results if necessary.         Meds ordered this encounter  Medications   nitrofurantoin, macrocrystal-monohydrate, (MACROBID) 100 MG capsule    Sig: Take 1 capsule (100 mg total) by mouth 2 (two) times daily for 5 days.    Dispense:  10 capsule    Refill:  0     Return if symptoms worsen or fail to improve.      Aden Agreste, MD  [facility] [phone] (phone) [phone] (fax)  [facility]

## 2024-01-24 LAB — URINALYSIS, MICROSCOPIC ONLY
Casts: NONE SEEN /LPF
Epithelial Cells (non renal): >10 /HPF — AB (ref 0–10)
RBC, Urine: NONE SEEN /HPF (ref 0–2)

## 2024-01-26 LAB — URINE CULTURE

## 2024-01-27 ENCOUNTER — Other Ambulatory Visit: Payer: Self-pay | Admitting: Family Medicine

## 2024-01-27 ENCOUNTER — Encounter: Payer: Self-pay | Admitting: Family Medicine

## 2024-01-27 ENCOUNTER — Ambulatory Visit: Payer: Self-pay

## 2024-01-27 LAB — CERVICOVAGINAL ANCILLARY ONLY
Bacterial Vaginitis (gardnerella): POSITIVE — AB
Candida Glabrata: NEGATIVE
Candida Vaginitis: POSITIVE — AB
Chlamydia: NEGATIVE
Comment: NEGATIVE
Comment: NEGATIVE
Comment: NEGATIVE
Comment: NEGATIVE
Comment: NEGATIVE
Comment: NORMAL
Neisseria Gonorrhea: NEGATIVE
Trichomonas: NEGATIVE

## 2024-01-27 MED ORDER — METRONIDAZOLE 500 MG PO TABS: 500.0000 mg | ORAL_TABLET | Freq: Two times a day (BID) | ORAL | 0 refills | Status: DC

## 2024-01-27 MED ORDER — FLUCONAZOLE 150 MG PO TABS: 150.0000 mg | ORAL_TABLET | Freq: Once | ORAL | 0 refills | Status: AC

## 2024-01-27 NOTE — Telephone Encounter (Unsigned)
 Copied from CRM [id number]. Topic: Clinical - Prescription Issue >> Jan 27, 2024  4:06 PM Melissa C wrote: Reason for CRM: Patient was prescribed new medications by doctor and they were sent to [facility], however patient called and requested that medications- fluconazole  (DIFLUCAN ) 150 MG tablet and metroNIDAZOLE  (FLAGYL ) 500 MG tablet be sent to [id number] [place], [place] - [address] [place], [place] [address] Phone: [phone]  Fax: [phone]  Instead. Please advise with patient if you have any questions. Thank you.

## 2024-01-27 NOTE — Telephone Encounter (Signed)
  RN called CAL and notified of [facility] [address] CAL was sending message to provider to send new script and no need to send CRM.          Copied from CRM [id number]. Topic: Clinical - Prescription Issue >> Jan 27, 2024  4:06 PM Melissa C wrote: Reason for CRM: Patient was prescribed new medications by doctor and they were sent to [facility], however patient called and requested that medications- fluconazole  (DIFLUCAN ) 150 MG tablet and metroNIDAZOLE  (FLAGYL ) 500 MG tablet be [facility] to [id number] [place], [place] - [address] [place], [place] [address] Phone: [phone]  Fax: [phone]  Instead. Please advise with patient if you have any questions. Thank you.

## 2024-01-27 NOTE — Telephone Encounter (Signed)
 Patient is requesting that [facility] [address] ( [facility]) today if possible. metroNIDAZOLE  (FLAGYL ) 500 MG tablet  fluconazole  (DIFLUCAN ) 150 MG tablet

## 2024-01-27 NOTE — Telephone Encounter (Signed)
 Ok to resend them to her preferred pharmacy

## 2024-03-20 ENCOUNTER — Encounter: Admitting: Family Medicine

## 2024-09-26 ENCOUNTER — Other Ambulatory Visit: Payer: Self-pay

## 2024-09-26 ENCOUNTER — Ambulatory Visit
Admission: RE | Admit: 2024-09-26 | Discharge: 2024-09-26 | Disposition: A | Discharge status: Home / Self Care | Attending: Family Medicine | Admitting: Family Medicine

## 2024-09-26 VITALS — BP 91/53 | HR 89 | Temp 98.4°F | Resp 20 | Ht 62.0 in | Wt 220.0 lb

## 2024-09-26 DIAGNOSIS — R059 Cough, unspecified: Secondary | ICD-10-CM | POA: Diagnosis not present

## 2024-09-26 DIAGNOSIS — J069 Acute upper respiratory infection, unspecified: Secondary | ICD-10-CM

## 2024-09-26 LAB — POCT INFLUENZA A/B
Influenza A, POC: NEGATIVE
Influenza B, POC: NEGATIVE

## 2024-09-26 LAB — POC SOFIA SARS ANTIGEN FIA: SARS Coronavirus 2 Ag: NEGATIVE

## 2024-09-26 MED ORDER — PREDNISONE 20 MG PO TABS: ORAL_TABLET | ORAL | 0 refills | Status: DC

## 2024-09-26 MED ORDER — AZITHROMYCIN 250 MG PO TABS: 250.0000 mg | ORAL_TABLET | Freq: Every day | ORAL | 0 refills | Status: DC

## 2024-09-26 NOTE — ED Triage Notes (Signed)
 Pt presenting with c/o non productive cough, chest congestion, right ear pain and body ache x 1 day. No medication taken for symptoms.

## 2024-09-26 NOTE — Discharge Instructions (Addendum)
 Advised patient take medication as directed with food to completion.  Advised take prednisone  and Zithromax  together for the next 5 days.  Encouraged to increase daily water intake to 64 ounces per day while taking this medication.  Advised if symptoms worsen and/or unresolved please follow-up with PCP or here for further evaluation.

## 2024-09-26 NOTE — ED Provider Notes (Signed)
 " Shelly Williams    CSN: [id number] Arrival date & time: 09/26/24  0849      History   Chief Complaint Chief Complaint  Patient presents with   Cough    Hurts to cough and makes it harder to take breaths . Can't lay down flat or struggle to breathe - Entered by patient    HPI Shelly Williams is a 43 y.o. female.   HPI Pleasant 43 year old female presents with cough reports it hurts to cough and difficulty breathing for 2 to 3 days.  PMH significant for obesity and migraine.  Patient is accompanied by her family today.  Past Medical History:  Diagnosis Date   Abdominal pain 2014   Depression    Gallstones 2014   History of kidney stones     Patient Active Problem List   Diagnosis Date Noted   Morbid obesity with BMI of 40.0-44.9, adult (HCC) 02/05/2023   Skin tag of vulva 02/05/2023   Migraine without aura and without status migrainosus, not intractable 09/25/2022   Rebound headache 09/25/2022   Trigeminal neuralgia of left side of face 05/30/2021   Gastroesophageal reflux disease 05/30/2021   Insomnia 02/14/2021   Cystocele, midline 02/15/2020   Urinary, incontinence, stress female 01/19/2020   MDD (major depressive disorder) 08/31/2019   Transient neurological symptoms 08/31/2019   Chronic pain of left knee 08/31/2019    Past Surgical History:  Procedure Laterality Date   BLADDER SUSPENSION N/A 06/28/2020   Procedure: PUBOVAGINAL SLING  PROCEDURE - TVT EXACT;  Surgeon: Arloa Lamar SQUIBB, MD;  Location: [facility];  Service: Gynecology;  Laterality: N/A;   CHOLECYSTECTOMY     CYSTOCELE REPAIR N/A 06/28/2020   Procedure: ANTERIOR REPAIR (CYSTOCELE);  Surgeon: Arloa Lamar SQUIBB, MD;  Location: [facility];  Service: Gynecology;  Laterality: N/A;   CYSTOSCOPY  06/28/2020   Procedure: CYSTOSCOPY;  Surgeon: Arloa Lamar SQUIBB, MD;  Location: [facility];  Service: Gynecology;;   TUBAL LIGATION      OB History     Gravida  6   Para  4   Term  4   Preterm       AB  2   Living  4      SAB  2   IAB      Ectopic      Multiple      Live Births  4        Obstetric Comments  1st Menstrual Cycle: 15 1st Pregnancy: 20          Home Medications    Prior to Admission medications  Medication Sig Start Date End Date Taking? Authorizing Provider  azithromycin  (ZITHROMAX ) 250 MG tablet Take 1 tablet (250 mg total) by mouth daily. Take first 2 tablets together, then 1 every day until finished. 09/26/24  Yes Teddy Sharper, FNP  predniSONE  (DELTASONE ) 20 MG tablet Take 3 tabs PO daily x 5 days. 09/26/24  Yes Teddy Sharper, FNP    Family History Family History  Problem Relation Age of Onset   Lung cancer Maternal Aunt        lung   Cancer Other        lung - maternal great aunt   Breast cancer Maternal Aunt        breast   Healthy Mother    Healthy Father    Healthy Sister    Healthy Brother    Healthy Daughter    Healthy Son    Healthy Daughter  Healthy Daughter    Colon cancer Neg Hx    Ovarian cancer Neg Hx     Social History Social History[1]   Allergies   Patient has no known allergies.   Review of Systems Review of Systems  HENT:  Positive for congestion.   Respiratory:  Positive for cough.   All other systems reviewed and are negative.    Physical Exam Triage Vital Signs ED Triage Vitals  Encounter Vitals Group     BP      Girls Systolic BP Percentile      Girls Diastolic BP Percentile      Boys Systolic BP Percentile      Boys Diastolic BP Percentile      Pulse      Resp      Temp      Temp src      SpO2      Weight      Height      Head Circumference      Peak Flow      Pain Score      Pain Loc      Pain Education      Exclude from Growth Chart    No data found.  Updated Vital Signs BP (!) 91/53 (BP Location: Right Arm)   Pulse 89   Temp 98.4 F (36.9 C) (Oral)   Resp 20   Ht 5' 2 (1.575 m)   Wt 220 lb (99.8 kg)   LMP 03/10/2023 Comment: very light and one day only  SpO2  96%   BMI 40.24 kg/m    Physical Exam Vitals and nursing note reviewed.  Constitutional:      Appearance: Normal appearance. She is obese. She is ill-appearing.  HENT:     Head: Normocephalic and atraumatic.     Right Ear: Tympanic membrane, ear canal and external ear normal.     Left Ear: Tympanic membrane, ear canal and external ear normal.     Mouth/Throat:     Mouth: Mucous membranes are moist.     Pharynx: Oropharynx is clear.  Eyes:     Extraocular Movements: Extraocular movements intact.     Conjunctiva/sclera: Conjunctivae normal.     Pupils: Pupils are equal, round, and reactive to light.  Cardiovascular:     Rate and Rhythm: Normal rate and regular rhythm.     Heart sounds: Normal heart sounds.  Pulmonary:     Effort: Pulmonary effort is normal.     Breath sounds: Normal breath sounds. No wheezing, rhonchi or rales.     Comments: Infrequent nonproductive cough on exam Skin:    General: Skin is warm and dry.  Neurological:     General: No focal deficit present.     Mental Status: She is alert and oriented to person, place, and time. Mental status is at baseline.  Psychiatric:        Mood and Affect: Mood normal.        Behavior: Behavior normal.      UC Treatments / Results  Labs (all labs ordered are listed, but only abnormal results are displayed) Labs Reviewed  POC SOFIA SARS ANTIGEN FIA - Normal  POCT INFLUENZA A/B - Normal    EKG   Radiology No results found.  Procedures Procedures (including critical Williams time)  Medications Ordered in UC Medications - No data to display  Initial Impression / Assessment and Plan / UC Course  I have reviewed the triage vital signs and  the nursing notes.  Pertinent labs & imaging results that were available during my Williams of the patient were reviewed by me and considered in my medical decision making (see chart for details).     MDM: 1.  Acute URI-Rx'd Zithromax : Take as directed; 2.  Cough, unspecified  type-Rx'd prednisone  20 mg tablet: Take 3 tabs p.o. daily x 5 days. Advised patient take medication as directed with food to completion.  Advised take prednisone  and Zithromax  together for the next 5 days.  Encouraged to increase daily water intake to 64 ounces per day while taking this medication.  Advised if symptoms worsen and/or unresolved please follow-up with PCP or here for further evaluation.  Patient discharged home, hemodynamically stable. Final Clinical Impressions(s) / UC Diagnoses   Final diagnoses:  Cough, unspecified type  Acute URI     Discharge Instructions      Advised patient take medication as directed with food to completion.  Advised take prednisone  and Zithromax  together for the next 5 days.  Encouraged to increase daily water intake to 64 ounces per day while taking this medication.  Advised if symptoms worsen and/or unresolved please follow-up with PCP or here for further evaluation.     ED Prescriptions     Medication Sig Dispense Auth. Provider   azithromycin  (ZITHROMAX ) 250 MG tablet Take 1 tablet (250 mg total) by mouth daily. Take first 2 tablets together, then 1 every day until finished. 6 tablet Pinchos Topel, FNP   predniSONE  (DELTASONE ) 20 MG tablet Take 3 tabs PO daily x 5 days. 15 tablet Breasia Karges, FNP      PDMP not reviewed this encounter.    [1]  Social History Tobacco Use   Smoking status: Never   Smokeless tobacco: Never  Vaping Use   Vaping status: Never Used  Substance Use Topics   Alcohol use: No   Drug use: No     Teddy Sharper, FNP 09/26/24 1011  "

## 2024-09-28 ENCOUNTER — Emergency Department (HOSPITAL_COMMUNITY)
Admission: EM | Admit: 2024-09-28 | Discharge: 2024-09-28 | Disposition: A | Discharge status: Home / Self Care | Attending: Emergency Medicine | Admitting: Emergency Medicine

## 2024-09-28 ENCOUNTER — Other Ambulatory Visit: Payer: Self-pay

## 2024-09-28 ENCOUNTER — Emergency Department (HOSPITAL_COMMUNITY)

## 2024-09-28 ENCOUNTER — Encounter (HOSPITAL_COMMUNITY): Payer: Self-pay

## 2024-09-28 DIAGNOSIS — J209 Acute bronchitis, unspecified: Secondary | ICD-10-CM | POA: Insufficient documentation

## 2024-09-28 DIAGNOSIS — R0602 Shortness of breath: Secondary | ICD-10-CM | POA: Diagnosis present

## 2024-09-28 DIAGNOSIS — J40 Bronchitis, not specified as acute or chronic: Secondary | ICD-10-CM

## 2024-09-28 MED ORDER — ALBUTEROL SULFATE (2.5 MG/3ML) 0.083% IN NEBU: 2.5000 mg | INHALATION_SOLUTION | Freq: Four times a day (QID) | RESPIRATORY_TRACT | 0 refills | Status: DC | PRN

## 2024-09-28 MED ORDER — ALBUTEROL SULFATE HFA 108 (90 BASE) MCG/ACT IN AERS: 2.0000 | INHALATION_SPRAY | RESPIRATORY_TRACT | 0 refills | Status: DC | PRN

## 2024-09-28 MED ORDER — BENZONATATE 100 MG PO CAPS: 100.0000 mg | ORAL_CAPSULE | Freq: Three times a day (TID) | ORAL | 0 refills | Status: DC

## 2024-09-28 MED ORDER — PREDNISONE 20 MG PO TABS: 40.0000 mg | ORAL_TABLET | Freq: Every day | ORAL | 0 refills | Status: DC

## 2024-09-28 NOTE — ED Triage Notes (Signed)
 Pt arrived via POV c/o recurrent URI symptoms. Pt reports being Dx with URI at Urgent Care 2 days ago and has been taking prescribed medications w/o relief. Pt reports her feeling of SOB has only worsened.

## 2024-09-28 NOTE — Discharge Instructions (Addendum)
 You were seen for your upper respiratory tract infection in the emergency department.   At home, please take the prednisone  and albuterol  you were prescribed. Use tea with honey or tessalon  pearls for your cough.    Check your MyChart online for the results of any tests that had not resulted by the time you left the emergency department.   Follow-up with your primary doctor in 2-3 days regarding your visit.    Return immediately to the emergency department if you experience any of the following: difficulty breathing, or any other concerning symptoms.    Thank you for visiting our Emergency Department. It was a pleasure taking care of you today.

## 2024-09-28 NOTE — ED Provider Notes (Signed)
 " [place] [facility] AT [facility] Provider Note   CSN: [id number] Arrival date & time: 09/28/24  1636     Patient presents with: URI   Shelly Williams is a 43 y.o. female.   43 year old female previously healthy presents emergency department with shortness of breath, congestion, and cough.  Productive of green sputum.  Has been going on for several days.  Was seen 2 days ago with negative COVID and flu at urgent care.  Started on prednisone  and azithromycin  but reports persistent symptoms.  No history of DVT or PE.  No history of cancer.  No surgery in the past month.  No hemoptysis.  Not on OCPs or hormones.       Prior to Admission medications  Medication Sig Start Date End Date Taking? Authorizing Provider  albuterol  (PROVENTIL ) (2.5 MG/3ML) 0.083% nebulizer solution Take 3 mLs (2.5 mg total) by nebulization every 6 (six) hours as needed for wheezing or shortness of breath. 09/28/24  Yes Yolande Lamar BROCKS, MD  albuterol  (VENTOLIN  HFA) 108 (90 Base) MCG/ACT inhaler Inhale 2 puffs into the lungs every 4 (four) hours as needed for wheezing or shortness of breath. 09/28/24  Yes Yolande Lamar BROCKS, MD  benzonatate  (TESSALON ) 100 MG capsule Take 1 capsule (100 mg total) by mouth every 8 (eight) hours. 09/28/24  Yes Yolande Lamar BROCKS, MD  azithromycin  (ZITHROMAX ) 250 MG tablet Take 1 tablet (250 mg total) by mouth daily. Take first 2 tablets together, then 1 every day until finished. 09/26/24   Teddy Sharper, FNP  predniSONE  (DELTASONE ) 20 MG tablet Take 2 tablets (40 mg total) by mouth daily. 09/28/24   Yolande Lamar BROCKS, MD    Allergies: Patient has no known allergies.    Review of Systems  Updated Vital Signs BP 123/67 (BP Location: Right Arm)   Pulse 87   Temp 98.5 F (36.9 C) (Oral)   Resp 19   Ht 5' 2 (1.575 m)   Wt 99.8 kg   LMP 03/10/2023 Comment: very light and one day only  SpO2 97%   BMI 40.24 kg/m   Physical Exam Vitals and nursing  note reviewed.  Constitutional:      General: She is not in acute distress.    Appearance: She is well-developed.  HENT:     Head: Normocephalic and atraumatic.     Right Ear: External ear normal.     Left Ear: External ear normal.     Nose: Nose normal.  Eyes:     Extraocular Movements: Extraocular movements intact.     Conjunctiva/sclera: Conjunctivae normal.     Pupils: Pupils are equal, round, and reactive to light.  Cardiovascular:     Rate and Rhythm: Normal rate and regular rhythm.     Heart sounds: No murmur heard. Pulmonary:     Effort: Pulmonary effort is normal. No respiratory distress.     Breath sounds: Normal breath sounds.  Musculoskeletal:     Cervical back: Normal range of motion and neck supple.     Right lower leg: No edema.     Left lower leg: No edema.  Skin:    General: Skin is warm and dry.  Neurological:     Mental Status: She is alert and oriented to person, place, and time. Mental status is at baseline.  Psychiatric:        Mood and Affect: Mood normal.     (all labs ordered are listed, but only abnormal results are displayed) Labs Reviewed -  No data to display  EKG: None  Radiology: Narrative  CLINICAL DATA:  Recent diagnosis of upper respiratory infection  presenting with worsening shortness of breath.    EXAM:  CHEST - 2 VIEW    COMPARISON:  December 08, 2012    FINDINGS:  The heart size and mediastinal contours are within normal limits.  Low lung volumes are noted with mild to moderate severity increased  suprahilar, perihilar and infrahilar lung markings. No focal  consolidation, pleural effusion or pneumothorax is identified. The  visualized skeletal structures are unremarkable.    IMPRESSION:  Low lung volumes with findings suggestive of mild to moderate  severity viral bronchitis versus reactive airway disease.      Electronically Signed    By: Suzen Dials M.D.    On: 09/28/2024 17:42     Procedures   Medications  Ordered in the ED - No data to display                                  Medical Decision Making Amount and/or Complexity of Data Reviewed Radiology: ordered.  Risk Prescription drug management.   Shelly Williams is a previously healthy 43 year old female presents emergency department with URI type symptoms for several days  Initial Ddx:  URI, pneumonia, sinusitis, bronchitis/asthma  MDM/Course:  Patient presents to the emergency department URI type symptoms.  Is taking azithromycin  and a steroid.  On exam is overall well-appearing.  She is satting 97% on room air.  No accessory muscle use.  Her lung sounds are clear to auscultation bilaterally.  Had a negative COVID and flu at urgent care.  Chest x-ray here without pneumonia but does show viral bronchitis versus reactive airway disease.  Upon re-evaluation remains overall well-appearing.  She reports that her symptoms have improved somewhat with her daughter's inhaler.  Will go ahead and give her 40 mg of prednisone  per day and an inhaler to use for her shortness of breath and symptoms at home though not convinced that this is asthma.  Will follow-up with her primary doctor in several days  This patient presents to the ED for concern of complaints listed in HPI, this involves an extensive number of treatment options, and is a complaint that carries with it a high risk of complications and morbidity. Disposition including potential need for admission considered.   Dispo: DC Home. Return precautions discussed including, but not limited to, those listed in the AVS. Allowed pt time to ask questions which were answered fully prior to dc.  I have reviewed the patients home medications and made adjustments as needed Additional history obtained from spouse Records reviewed Outpatient Clinic Notes I independently reviewed the following imaging with scope of interpretation limited to determining acute life threatening conditions related to  emergency care: Chest x-ray and agree with the radiologist interpretation with the following exceptions: none I personally reviewed and interpreted cardiac monitoring: normal sinus rhythm  I personally reviewed and interpreted the pt's EKG: see above for interpretation   Portions of this note were generated with Dragon dictation software. Dictation errors may occur despite best attempts at proofreading.     Final diagnoses:  Bronchitis    ED Discharge Orders          Ordered    predniSONE  (DELTASONE ) 20 MG tablet  Daily        09/28/24 1815    albuterol  (VENTOLIN  HFA) 108 (90 Base) MCG/ACT inhaler  Every 4 hours PRN        09/28/24 1815    albuterol  (PROVENTIL ) (2.5 MG/3ML) 0.083% nebulizer solution  Every 6 hours PRN        09/28/24 1815    benzonatate  (TESSALON ) 100 MG capsule  Every 8 hours        09/28/24 1815               Yolande Lamar BROCKS, MD 10/03/24 [phone]  "

## 2024-10-05 ENCOUNTER — Ambulatory Visit (INDEPENDENT_AMBULATORY_CARE_PROVIDER_SITE_OTHER): Admitting: Family Medicine

## 2024-10-05 ENCOUNTER — Encounter: Payer: Self-pay | Admitting: Family Medicine

## 2024-10-05 VITALS — BP 121/73 | HR 70 | Temp 97.8°F | Resp 16 | Ht 62.0 in | Wt 228.1 lb

## 2024-10-05 DIAGNOSIS — Z1159 Encounter for screening for other viral diseases: Secondary | ICD-10-CM | POA: Diagnosis not present

## 2024-10-05 DIAGNOSIS — J40 Bronchitis, not specified as acute or chronic: Secondary | ICD-10-CM | POA: Diagnosis not present

## 2024-10-05 DIAGNOSIS — Z789 Other specified health status: Secondary | ICD-10-CM | POA: Diagnosis not present

## 2024-10-05 DIAGNOSIS — Z0000 Encounter for general adult medical examination without abnormal findings: Secondary | ICD-10-CM | POA: Diagnosis not present

## 2024-10-05 DIAGNOSIS — Z23 Encounter for immunization: Secondary | ICD-10-CM | POA: Diagnosis not present

## 2024-10-05 DIAGNOSIS — Z6841 Body Mass Index (BMI) 40.0 and over, adult: Secondary | ICD-10-CM | POA: Diagnosis not present

## 2024-10-05 DIAGNOSIS — Z1231 Encounter for screening mammogram for malignant neoplasm of breast: Secondary | ICD-10-CM | POA: Diagnosis not present

## 2024-10-05 DIAGNOSIS — E559 Vitamin D deficiency, unspecified: Secondary | ICD-10-CM

## 2024-10-05 NOTE — Progress Notes (Signed)
 "    Complete physical exam   Patient: Shelly Williams   DOB: 1981/05/07   43 y.o. Female  MRN: [id number] Visit Date: 10/05/2024  Today's healthcare provider: Jon Eva, MD   Chief Complaint  Patient presents with   Annual Exam    Last physical: unknown   Cough    Still ongoing since 09/23/24   Subjective    Shelly Williams is a 43 y.o. female who presents today for a complete physical exam.   Discussed the use of AI scribe software for clinical note transcription with the patient, who gave verbal consent to proceed.  History of Present Illness   Shelly Williams is a 43 year old female who presents for an annual physical exam and follow-up for bronchitis.  She has persistent bronchitis symptoms with nocturnal and early morning cough and nighttime breathing difficulty. BiPAP helps briefly, but she then develops shaking and recurrent symptoms. A recent ER course of prednisone  improved symptoms. She uses albuterol  and prescription cough medicines with partial relief, and also uses honey products.  She is considering gastric sleeve surgery, possibly in [place] or locally, and wants medical assessment before proceeding. She has tried diet-based weight loss but gained weight.  She plans to receive a tetanus vaccine today and declined a flu shot. She is agreeable to hepatitis B and C serologies. She is due for a screening mammogram and plans to schedule it.        Last depression screening scores    01/23/2024   10:30 AM 01/23/2024   10:19 AM 05/10/2023    8:52 AM  PHQ 2/9 Scores  PHQ - 2 Score 2 2 1   PHQ- 9 Score 8  8  4       Data saved with a previous flowsheet row definition   Last fall risk screening    01/23/2024   10:19 AM  Fall Risk   Falls in the past year? 0  Number falls in past yr: 0  Injury with Fall? 0   Risk for fall due to : No Fall Risks  Follow up Falls evaluation completed     Data saved with a previous flowsheet row definition         Medications: Show/hide medication list[1]  Review of Systems    Objective    BP 121/73   Pulse 70   Temp 97.8 F (36.6 C) (Oral)   Resp 16   Ht 5' 2 (1.575 m)   Wt 228 lb 1.6 oz (103.5 kg)   LMP 03/10/2023 Comment: very light and one day only  SpO2 98%   BMI 41.72 kg/m    Physical Exam Vitals reviewed.  Constitutional:      General: She is not in acute distress.    Appearance: Normal appearance. She is well-developed. She is obese. She is not diaphoretic.  HENT:     Head: Normocephalic and atraumatic.     Right Ear: Tympanic membrane, ear canal and external ear normal.     Left Ear: Tympanic membrane, ear canal and external ear normal.     Nose: Nose normal.     Mouth/Throat:     Mouth: Mucous membranes are moist.     Pharynx: Oropharynx is clear. No oropharyngeal exudate.  Eyes:     General: No scleral icterus.    Conjunctiva/sclera: Conjunctivae normal.     Pupils: Pupils are equal, round, and reactive to light.  Neck:     Thyroid: No thyromegaly.  Cardiovascular:  Rate and Rhythm: Normal rate and regular rhythm.     Heart sounds: Normal heart sounds. No murmur heard. Pulmonary:     Effort: Pulmonary effort is normal. No respiratory distress.     Breath sounds: Normal breath sounds. No wheezing or rales.  Abdominal:     General: There is no distension.     Palpations: Abdomen is soft.     Tenderness: There is no abdominal tenderness.  Musculoskeletal:        General: No deformity.     Cervical back: Neck supple.     Right lower leg: No edema.     Left lower leg: No edema.  Lymphadenopathy:     Cervical: No cervical adenopathy.  Skin:    General: Skin is warm and dry.  Neurological:     Mental Status: She is alert and oriented to person, place, and time. Mental status is at baseline.     Gait: Gait normal.  Psychiatric:        Mood and Affect: Mood normal.        Behavior: Behavior normal.        Thought Content: Thought content  normal.      No results found for any visits on 10/05/24.  Assessment & Plan    Routine Health Maintenance and Physical Exam  Exercise Activities and Dietary recommendations  Goals   None     Immunization History  Administered Date(s) Administered   Influenza,inj,Quad PF,6+ Mos 08/31/2019, 08/18/2020, 10/30/2021, 07/23/2022   Influenza-Unspecified 08/18/2010, 08/31/2019, 10/30/2021   Janssen (J&J) SARS-COV-2 Vaccination 12/19/2019, 01/07/2020   PFIZER(Purple Top)SARS-COV-2 Vaccination 08/04/2020   Tdap 03/10/2014, 10/05/2024    Health Maintenance  Topic Date Due   Hepatitis C Screening  Never done   Hepatitis B Vaccines 19-59 Average Risk (1 of 3 - 19+ 3-dose series) Never done   HPV VACCINES (1 - Risk 3-dose SCDM series) Never done   Mammogram  Never done   COVID-19 Vaccine (4 - 2025-26 season) 06/08/2024   Influenza Vaccine  01/05/2025 (Originally 05/08/2024)   Cervical Cancer Screening (HPV/Pap Cotest)  01/17/2025   DTaP/Tdap/Td (3 - Td or Tdap) 10/05/2034   HIV Screening  Completed   Pneumococcal Vaccine  Aged Out   Meningococcal B Vaccine  Aged Out    Discussed health benefits of physical activity, and encouraged her to engage in regular exercise appropriate for her age and condition.  Problem List Items Addressed This Visit       Other   Morbid obesity with BMI of 40.0-44.9, adult (HCC)   Relevant Orders   Amb Referral to Bariatric Surgery   TSH   CBC with Differential/Platelet   Comprehensive metabolic panel with GFR   Lipid panel   VITAMIN D  25 Hydroxy (Vit-D Deficiency, Fractures)   Other Visit Diagnoses       Encounter for annual physical exam    -  Primary   Relevant Orders   Hepatitis B Surface AntiBODY   Hepatitis C Antibody   MM 3D SCREENING MAMMOGRAM BILATERAL BREAST   TSH   CBC with Differential/Platelet   Comprehensive metabolic panel with GFR   Lipid panel   VITAMIN D  25 Hydroxy (Vit-D Deficiency, Fractures)     Bronchitis          Hepatitis B vaccination status unknown       Relevant Orders   Hepatitis B Surface AntiBODY     Need for hepatitis C screening test       Relevant Orders  Hepatitis C Antibody     Breast cancer screening by mammogram       Relevant Orders   MM 3D SCREENING MAMMOGRAM BILATERAL BREAST     Avitaminosis D       Relevant Orders   VITAMIN D  25 Hydroxy (Vit-D Deficiency, Fractures)          Bronchitis Persistent cough, primarily nocturnal and in the morning, likely viral in origin. Previous treatment with prednisone  and albuterol  provided some relief. Cough may persist for up to a month. No indication for antibiotics as bronchitis is viral. - Continue albuterol  as needed for symptom relief. - Consider honey or honey cough drops for cough management. - Reassured that cough may persist for a while.  Morbid obesity Considering gastric sleeve surgery [place] but interested in local options due to cost and convenience. Medicaid covers gastric sleeve surgery in the [place] . Discussed potential benefits of local surgery, including easier access to post-operative care and cost savings. - Referred to bariatric surgery group in [facility] for consultation. - Ordered comprehensive lab work to assess overall health before surgery. - Discussed potential need for psychiatric and nutritional evaluations as part of pre-surgical requirements.  Vitamin D  deficiency Previous lab results indicated low vitamin D  levels. - Ordered vitamin D  level as part of routine lab work.  General Health Maintenance Due for routine health maintenance screenings and vaccinations. Discussed tetanus vaccination, hepatitis B immunity status, and hepatitis C screening. Mammogram is due for breast cancer screening. - Administered tetanus vaccine today. - Ordered hepatitis B immunity status and hepatitis C screening. - Ordered mammogram for breast cancer screening. - Ordered routine lab work including kidney and liver function,  blood sugar, cholesterol, blood counts, and thyroid function.        Return in about 1 year (around 10/05/2025) for CPE.     Jon Eva, MD  [facility] [phone] (phone) [phone] (fax)  [facility]      [1]  Outpatient Medications Prior to Visit  Medication Sig   albuterol  (PROVENTIL ) (2.5 MG/3ML) 0.083% nebulizer solution Take 3 mLs (2.5 mg total) by nebulization every 6 (six) hours as needed for wheezing or shortness of breath.   albuterol  (VENTOLIN  HFA) 108 (90 Base) MCG/ACT inhaler Inhale 2 puffs into the lungs every 4 (four) hours as needed for wheezing or shortness of breath.   benzonatate  (TESSALON ) 100 MG capsule Take 1 capsule (100 mg total) by mouth every 8 (eight) hours.   [DISCONTINUED] azithromycin  (ZITHROMAX ) 250 MG tablet Take 1 tablet (250 mg total) by mouth daily. Take first 2 tablets together, then 1 every day until finished.   [DISCONTINUED] predniSONE  (DELTASONE ) 20 MG tablet Take 2 tablets (40 mg total) by mouth daily.   No facility-administered medications prior to visit.   "

## 2024-10-05 NOTE — Patient Instructions (Signed)
 Call [facility] to schedule a mammogram [phone]

## 2024-10-06 ENCOUNTER — Ambulatory Visit: Payer: Self-pay | Admitting: Family Medicine

## 2024-10-06 DIAGNOSIS — D72829 Elevated white blood cell count, unspecified: Secondary | ICD-10-CM

## 2024-10-06 LAB — CBC WITH DIFFERENTIAL/PLATELET
Basophils Absolute: 0.1 x10E3/uL (ref 0.0–0.2)
Basos: 1 %
EOS (ABSOLUTE): 0 x10E3/uL (ref 0.0–0.4)
Eos: 0 %
Hematocrit: 41.4 % (ref 34.0–46.6)
Hemoglobin: 13.6 g/dL (ref 11.1–15.9)
Immature Grans (Abs): 0.4 x10E3/uL — ABNORMAL HIGH (ref 0.0–0.1)
Immature Granulocytes: 2 %
Lymphocytes Absolute: 4.1 x10E3/uL — ABNORMAL HIGH (ref 0.7–3.1)
Lymphs: 23 %
MCH: 28.5 pg (ref 26.6–33.0)
MCHC: 32.9 g/dL (ref 31.5–35.7)
MCV: 87 fL (ref 79–97)
Monocytes Absolute: 1.2 x10E3/uL — ABNORMAL HIGH (ref 0.1–0.9)
Monocytes: 7 %
Neutrophils Absolute: 11.8 x10E3/uL — ABNORMAL HIGH (ref 1.4–7.0)
Neutrophils: 67 %
Platelets: 216 x10E3/uL (ref 150–450)
RBC: 4.78 x10E6/uL (ref 3.77–5.28)
RDW: 13.3 % (ref 11.7–15.4)
WBC: 17.6 x10E3/uL — ABNORMAL HIGH (ref 3.4–10.8)

## 2024-10-06 LAB — COMPREHENSIVE METABOLIC PANEL WITH GFR
ALT: 40 IU/L — ABNORMAL HIGH (ref 0–32)
AST: 16 IU/L (ref 0–40)
Albumin: 3.8 g/dL — ABNORMAL LOW (ref 3.9–4.9)
Alkaline Phosphatase: 90 IU/L (ref 41–116)
BUN/Creatinine Ratio: 21 (ref 9–23)
BUN: 12 mg/dL (ref 6–24)
Bilirubin Total: 0.8 mg/dL (ref 0.0–1.2)
CO2: 21 mmol/L (ref 20–29)
Calcium: 8.9 mg/dL (ref 8.7–10.2)
Chloride: 105 mmol/L (ref 96–106)
Creatinine, Ser: 0.57 mg/dL (ref 0.57–1.00)
Globulin, Total: 2.5 g/dL (ref 1.5–4.5)
Glucose: 94 mg/dL (ref 70–99)
Potassium: 4.2 mmol/L (ref 3.5–5.2)
Sodium: 140 mmol/L (ref 134–144)
Total Protein: 6.3 g/dL (ref 6.0–8.5)
eGFR: 116 mL/min/1.73

## 2024-10-06 LAB — VITAMIN D 25 HYDROXY (VIT D DEFICIENCY, FRACTURES): Vit D, 25-Hydroxy: 15 ng/mL — ABNORMAL LOW (ref 30.0–100.0)

## 2024-10-06 LAB — LIPID PANEL
Chol/HDL Ratio: 2.4 ratio (ref 0.0–4.4)
Cholesterol, Total: 153 mg/dL (ref 100–199)
HDL: 65 mg/dL
LDL Chol Calc (NIH): 76 mg/dL (ref 0–99)
Triglycerides: 58 mg/dL (ref 0–149)
VLDL Cholesterol Cal: 12 mg/dL (ref 5–40)

## 2024-10-06 LAB — HEPATITIS B SURFACE ANTIBODY,QUALITATIVE: Hep B Surface Ab, Qual: NONREACTIVE

## 2024-10-06 LAB — HEPATITIS C ANTIBODY: Hep C Virus Ab: NONREACTIVE

## 2024-10-06 LAB — TSH: TSH: 2.18 u[IU]/mL (ref 0.450–4.500)

## 2024-10-23 ENCOUNTER — Telehealth: Payer: Self-pay | Admitting: Family Medicine

## 2024-10-23 NOTE — Telephone Encounter (Signed)
 Pt's husband dropped off form from [facility] for Bariatric PCP surgery support letter.  Please complete and call pt when ready for pick up.

## 2024-10-26 NOTE — Telephone Encounter (Signed)
 Yes - needs appt

## 2024-10-26 NOTE — Telephone Encounter (Signed)
 Last read by Shevy Illescas at 10:57AM on 10/26/2024.

## 2024-10-27 ENCOUNTER — Encounter: Admitting: Family Medicine

## 2024-10-29 ENCOUNTER — Encounter: Payer: Self-pay | Admitting: Family Medicine

## 2024-10-29 ENCOUNTER — Ambulatory Visit: Admitting: Family Medicine

## 2024-10-29 VITALS — BP 114/71 | HR 86 | Resp 14 | Ht 62.0 in | Wt 226.0 lb

## 2024-10-29 DIAGNOSIS — N941 Unspecified dyspareunia: Secondary | ICD-10-CM

## 2024-10-29 DIAGNOSIS — Z6841 Body Mass Index (BMI) 40.0 and over, adult: Secondary | ICD-10-CM

## 2024-10-29 DIAGNOSIS — Z23 Encounter for immunization: Secondary | ICD-10-CM | POA: Diagnosis not present

## 2024-10-29 NOTE — Progress Notes (Signed)
 "     Established patient visit   Patient: Shelly Williams   DOB: 1980-11-19   44 y.o. Female  MRN: [id number] Visit Date: 10/29/2024  Today's healthcare provider: Jon Eva, MD   Chief Complaint  Patient presents with   Form Completion   Subjective    HPI   Discussed the use of AI scribe software for clinical note transcription with the patient, who gave verbal consent to proceed.  History of Present Illness   Shelly Williams is a 44 year old female who presents for a pre-bariatric surgery evaluation and vaginal dryness.  She has attempted weight loss since 2020, including phentermine  and Topamax  (Qsymia) since 2021. She exercises regularly with sumo, kickboxing, and cycling, and prefers cycling. No medical problems limit her ability to exercise.  She has persistent vaginal dryness and pain despite estrogen cream, with pain lasting for a couple of hours after intercourse and partially relieved by warm showers. Symptoms have led to low libido and marital strain.  She has migraines, reflux, obesity, and depression. She is not taking prescription medications.  She asks to review her hepatitis B status.         Medications: Show/hide medication list[1]  Review of Systems     Objective    BP 114/71   Pulse 86   Resp 14   Ht 5' 2 (1.575 m)   Wt 226 lb (102.5 kg)   LMP 03/10/2023 Comment: very light and one day only  SpO2 99%   BMI 41.34 kg/m    Physical Exam Vitals reviewed.  Constitutional:      General: She is not in acute distress.    Appearance: She is well-developed.  HENT:     Head: Normocephalic and atraumatic.  Eyes:     General: No scleral icterus.    Conjunctiva/sclera: Conjunctivae normal.  Cardiovascular:     Rate and Rhythm: Normal rate and regular rhythm.  Pulmonary:     Effort: Pulmonary effort is normal. No respiratory distress.  Skin:    General: Skin is warm and dry.     Findings: No rash.  Neurological:     Mental Status:  She is alert and oriented to person, place, and time.  Psychiatric:        Behavior: Behavior normal.      No results found for any visits on 10/29/24.  Assessment & Plan     Problem List Items Addressed This Visit       Other   Morbid obesity with BMI of 40.0-44.9, adult (HCC) - Primary   Other Visit Diagnoses       Dyspareunia in female       Relevant Orders   Ambulatory referral to Gynecology     Immunization due               Morbid obesity She has been working on weight loss for approximately five years, using phentermine  and Topamax  (Qsymia) since 2021. She has tried various exercises, including cycling, which she found most beneficial. She has no medical conditions hindering her ability to exercise. She has decided to proceed with bariatric surgery and requires insurance approval. - Submitted necessary documentation for insurance approval of bariatric surgery. - Supported decision to undergo bariatric surgery.  Dyspareunia She reports persistent vaginal dryness and pain despite using vaginal estrogen cream. The pain affects her libido and causes distress in her relationship. Vaginal estrogen cream is not providing relief, and she is considering further evaluation by a gynecologist. - Referred  to gynecology for further evaluation and management of dyspareunia.  Hepatitis B immunization She is not immune to hepatitis B and has not had a previous infection. She is recommended to receive the hepatitis B vaccine to prevent infection. - Administered first dose of hepatitis B vaccine today. - Scheduled follow-up nurse visit in one month for second dose of hepatitis B vaccine.        Return in about 1 month (around 11/29/2024) for Hep B #2 with nurses.       Jon Eva, MD  [facility] [phone] (phone) [phone] (fax)   [facility]     [1]  Outpatient Medications Prior to Visit  Medication Sig    [DISCONTINUED] albuterol  (PROVENTIL ) (2.5 MG/3ML) 0.083% nebulizer solution Take 3 mLs (2.5 mg total) by nebulization every 6 (six) hours as needed for wheezing or shortness of breath.   [DISCONTINUED] albuterol  (VENTOLIN  HFA) 108 (90 Base) MCG/ACT inhaler Inhale 2 puffs into the lungs every 4 (four) hours as needed for wheezing or shortness of breath.   [DISCONTINUED] benzonatate  (TESSALON ) 100 MG capsule Take 1 capsule (100 mg total) by mouth every 8 (eight) hours.   No facility-administered medications prior to visit.   "

## 2024-10-30 ENCOUNTER — Encounter (HOSPITAL_COMMUNITY)

## 2024-11-12 NOTE — Telephone Encounter (Signed)
 Form has been completed and placed on sorter to be faxed and copied.

## 2024-11-12 NOTE — Telephone Encounter (Signed)
 Pt's spouse dropped off another form for this same thing and is asking for it to be faxed when completed and a copy left at the front desk for pt.  He states that the pt forgot to give this portion to Dr. B.  Placed in Dr. Rona box.

## 2024-11-26 ENCOUNTER — Encounter

## 2024-11-30 ENCOUNTER — Ambulatory Visit: Admitting: Family Medicine

## 2025-10-07 ENCOUNTER — Encounter: Admitting: Family Medicine
